# Patient Record
Sex: Female | Born: 1935 | Race: Black or African American | Hispanic: Refuse to answer | Smoking: Former smoker
Health system: Southern US, Community
[De-identification: ages and names within clinical notes are randomized; demographics above are authoritative.]

## PROBLEM LIST (undated history)

## (undated) DIAGNOSIS — Z9889 Other specified postprocedural states: Secondary | ICD-10-CM

## (undated) DIAGNOSIS — R0602 Shortness of breath: Secondary | ICD-10-CM

## (undated) DIAGNOSIS — E789 Disorder of lipoprotein metabolism, unspecified: Secondary | ICD-10-CM

## (undated) DIAGNOSIS — M810 Age-related osteoporosis without current pathological fracture: Secondary | ICD-10-CM

## (undated) DIAGNOSIS — R112 Nausea with vomiting, unspecified: Secondary | ICD-10-CM

## (undated) DIAGNOSIS — M1990 Unspecified osteoarthritis, unspecified site: Secondary | ICD-10-CM

## (undated) DIAGNOSIS — R062 Wheezing: Secondary | ICD-10-CM

## (undated) DIAGNOSIS — R202 Paresthesia of skin: Secondary | ICD-10-CM

## (undated) DIAGNOSIS — I2510 Atherosclerotic heart disease of native coronary artery without angina pectoris: Secondary | ICD-10-CM

## (undated) DIAGNOSIS — E785 Hyperlipidemia, unspecified: Secondary | ICD-10-CM

## (undated) DIAGNOSIS — J310 Chronic rhinitis: Secondary | ICD-10-CM

## (undated) DIAGNOSIS — C439 Malignant melanoma of skin, unspecified: Secondary | ICD-10-CM

## (undated) DIAGNOSIS — C801 Malignant (primary) neoplasm, unspecified: Secondary | ICD-10-CM

## (undated) DIAGNOSIS — I739 Peripheral vascular disease, unspecified: Secondary | ICD-10-CM

## (undated) DIAGNOSIS — J449 Chronic obstructive pulmonary disease, unspecified: Secondary | ICD-10-CM

## (undated) DIAGNOSIS — R200 Anesthesia of skin: Secondary | ICD-10-CM

## (undated) HISTORY — PX: SKIN BIOPSY: SHX1

## (undated) HISTORY — DX: Anesthesia of skin: R20.0

## (undated) HISTORY — DX: Unspecified osteoarthritis, unspecified site: M19.90

## (undated) HISTORY — DX: Chronic obstructive pulmonary disease, unspecified: J44.9

## (undated) HISTORY — PX: ANGIOPLASTY: SHX39

## (undated) HISTORY — DX: Age-related osteoporosis without current pathological fracture: M81.0

## (undated) HISTORY — DX: Hyperlipidemia, unspecified: E78.5

## (undated) HISTORY — DX: Other specified postprocedural states: Z98.890

## (undated) HISTORY — DX: Peripheral vascular disease, unspecified: I73.9

## (undated) HISTORY — DX: Disorder of lipoprotein metabolism, unspecified: E78.9

## (undated) HISTORY — DX: Chronic rhinitis: J31.0

## (undated) HISTORY — PX: COLONOSCOPY: SHX174

## (undated) HISTORY — DX: Wheezing: R06.2

## (undated) HISTORY — PX: SPINE SURGERY: SHX786

## (undated) HISTORY — DX: Malignant (primary) neoplasm, unspecified: C80.1

## (undated) HISTORY — DX: Shortness of breath: R06.02

## (undated) HISTORY — DX: Nausea with vomiting, unspecified: R11.2

## (undated) HISTORY — DX: Atherosclerotic heart disease of native coronary artery without angina pectoris: I25.10

## (undated) HISTORY — DX: Malignant melanoma of skin, unspecified: C43.9

## (undated) HISTORY — PX: EYE SURGERY: SHX253

## (undated) HISTORY — DX: Paresthesia of skin: R20.2

## (undated) HISTORY — PX: OTHER SURGICAL HISTORY: SHX169

---

## 2017-03-19 HISTORY — PX: FEMORAL-POPLITEAL BYPASS GRAFT: SHX937

## 2017-03-19 HISTORY — PX: THROMBECTOMY FEMORAL ARTERY: SHX6406

## 2017-03-19 HISTORY — PX: FASCIOTOMY: SHX132

## 2019-02-06 ENCOUNTER — Encounter: Payer: Self-pay | Admitting: Vascular Surgery

## 2019-02-26 ENCOUNTER — Ambulatory Visit: Payer: Medicare Other | Admitting: Rheumatology

## 2019-03-17 ENCOUNTER — Ambulatory Visit (INDEPENDENT_AMBULATORY_CARE_PROVIDER_SITE_OTHER): Payer: Medicare Other | Admitting: Medical

## 2019-03-17 ENCOUNTER — Other Ambulatory Visit: Payer: Self-pay

## 2019-03-17 ENCOUNTER — Encounter: Payer: Self-pay | Admitting: Medical

## 2019-03-17 VITALS — BP 145/81 | HR 95 | Temp 98.2°F | Resp 18 | Ht 62.0 in | Wt 113.8 lb

## 2019-03-17 DIAGNOSIS — R030 Elevated blood-pressure reading, without diagnosis of hypertension: Secondary | ICD-10-CM | POA: Diagnosis not present

## 2019-03-17 DIAGNOSIS — R7989 Other specified abnormal findings of blood chemistry: Secondary | ICD-10-CM | POA: Diagnosis not present

## 2019-03-17 DIAGNOSIS — I739 Peripheral vascular disease, unspecified: Secondary | ICD-10-CM

## 2019-03-17 DIAGNOSIS — M7062 Trochanteric bursitis, left hip: Secondary | ICD-10-CM

## 2019-03-17 DIAGNOSIS — E785 Hyperlipidemia, unspecified: Secondary | ICD-10-CM

## 2019-03-17 DIAGNOSIS — M810 Age-related osteoporosis without current pathological fracture: Secondary | ICD-10-CM

## 2019-03-17 DIAGNOSIS — Z86718 Personal history of other venous thrombosis and embolism: Secondary | ICD-10-CM

## 2019-03-17 MED ORDER — VITAMIN D (ERGOCALCIFEROL) 1.25 MG (50000 UNIT) PO CAPS: 50000.0000 [IU] | ORAL_CAPSULE | ORAL | 2 refills | Status: DC

## 2019-03-17 MED ORDER — ATORVASTATIN CALCIUM 80 MG PO TABS: 80.0000 mg | ORAL_TABLET | Freq: Every day | ORAL | 3 refills | Status: DC

## 2019-03-17 NOTE — Progress Notes (Signed)
Subjective:    Patient ID: Holly Briggs, female    DOB: [birth year], 84 y.o.   MRN: [id number]  HPI  Pt in for the first time. Pt new to area for 4 weeks. Pt moved from [facility]. Had lived there 17 years. Pt has relative that in [place].    Pt has some left hip area pain. Pt in the past saw rheumatologist. She wants to see new rheumatologist. Pt states appointment was given for [facility] rheumatologist scheduled for June 09, 2019. Pt states in a lot of pain so she will be will to see sports medicine MD.  Pt has high cholesterol in the past.   Also has some vascular issues(PAD on review) Pt states hx of clot in legs years ago also. She was given eliquis. Pt has appointment with Dr.  Donnetta Hutching already made for 2 weeks. Pt former pcp in [facility] arranged that.  Pt has hx of scoliosis and osteoporosis.   Also low vit D    Review of Systems  Constitutional: Negative for chills, fatigue and fever.  HENT: Negative for congestion and drooling.   Respiratory: Negative for cough, chest tightness, shortness of breath and wheezing.   Cardiovascular: Negative for palpitations.  Gastrointestinal: Negative for abdominal distention, abdominal pain, nausea and vomiting.  Genitourinary: Negative for difficulty urinating and dysuria.  Musculoskeletal:       Left hip pain.  Skin: Negative for rash.  Neurological: Negative for dizziness, syncope, weakness, numbness and headaches.  Hematological: Negative for adenopathy. Does not bruise/bleed easily.  Psychiatric/Behavioral: Negative for behavioral problems and decreased concentration. The patient is not nervous/anxious.     Past Medical History:  Diagnosis Date  . Arthritis   . Cancer (HCC)    Basal cell carcinoma  . Cancer (HCC)    Squamous cell carcinoma  . COPD (chronic obstructive pulmonary disease) ([place])   . Coronary artery disease   . Hyperlipidemia   . Lipid disorder   . Melanoma ([place])   . Numbness and tingling of right leg    Mild  residual  . Osteoporosis   . Peripheral arterial disease ([place])   . PONV (postoperative nausea and vomiting)   . Rhinitis   . Shortness of breath   . Wheezing      Social History   Socioeconomic History  . Marital status: Divorced    Spouse name: Not on file  . Number of children: Not on file  . Years of education: Not on file  . Highest education level: Not on file  Occupational History  . Not on file  Tobacco Use  . Smoking status: Former Smoker    Packs/day: 1.00    Years: 50.00    Pack years: 50.00    Types: Cigarettes    Quit date: 03/17/2007    Years since quitting: 12.0  . Smokeless tobacco: Never Used  Substance and Sexual Activity  . Alcohol use: Never  . Drug use: Never  . Sexual activity: Not Currently    Birth control/protection: None  Other Topics Concern  . Not on file  Social History Narrative  . Not on file   Social Determinants of Health   Financial Resource Strain:   . Difficulty of Paying Living Expenses: Not on file  Food Insecurity:   . Worried About Charity fundraiser in the Last Year: Not on file  . Ran Out of Food in the Last Year: Not on file  Transportation Needs:   . Lack of Transportation (Medical): Not  on file  . Lack of Transportation (Non-Medical): Not on file  Physical Activity:   . Days of Exercise per Week: Not on file  . Minutes of Exercise per Session: Not on file  Stress:   . Feeling of Stress : Not on file  Social Connections:   . Frequency of Communication with Friends and Family: Not on file  . Frequency of Social Gatherings with Friends and Family: Not on file  . Attends Religious Services: Not on file  . Active Member of Clubs or Organizations: Not on file  . Attends Archivist Meetings: Not on file  . Marital Status: Not on file  Intimate Partner Violence:   . Fear of Current or Ex-Partner: Not on file  . Emotionally Abused: Not on file  . Physically Abused: Not on file  . Sexually Abused: Not on file     Past Surgical History:  Procedure Laterality Date  . ANGIOPLASTY    . Arteriography    . COLONOSCOPY    . EYE SURGERY     Laser vision correction  . FASCIOTOMY  03/2017   4 compartment.  . FEMORAL-POPLITEAL BYPASS GRAFT  03/2017   with Prosthetic for ciritcal lim ischemia, 2/2 thrombosis of existing graft.  . Iliofemoral Endarterectomy Right   . SKIN BIOPSY    . SPINE SURGERY     Fusion, Lumbar  . THROMBECTOMY FEMORAL ARTERY  03/2017    Family History  Problem Relation Age of Onset  . Hyperlipidemia Mother   . Pneumonia Father   . Hyperlipidemia Sister   . Hyperlipidemia Brother   . Osteoporosis Sister     Allergies  Allergen Reactions  . Penicillins     "Mouth broke out in sores"  . Prednisone Diarrhea    Current Outpatient Medications on File Prior to Visit  Medication Sig Dispense Refill  . apixaban (ELIQUIS) 2.5 MG TABS tablet Take 2.5 mg by mouth 2 (two) times daily.    Marland Kitchen aspirin EC 81 MG tablet Take 81 mg by mouth daily.    . Riboflavin (VITAMIN B-2 PO) Take 1 tablet by mouth once a week.     No current facility-administered medications on file prior to visit.    BP (!) 145/81 (BP Location: Left Arm, Patient Position: Sitting, Cuff Size: Normal)   Pulse 95   Temp 98.2 F (36.8 C) (Oral)   Resp 18   Ht 5\' 2"  (1.575 m)   Wt 113 lb 12.8 oz (51.6 kg)   SpO2 99%   BMI 20.81 kg/m       Objective:   Physical Exam   General- No acute distress. Pleasant patient. Neck- Full range of motion, no jvd Lungs- Clear, even and unlabored. Heart- regular rate and rhythm. Neurologic- CNII- XII grossly intact.  Left hip- pain on range of motion.     Assessment & Plan:  For left hip pain, I put in referral to sports medicine. Can call them and get scheduled.  For future labs please get scheduled for those fasting.  For dvt hx continue eliquis.  For PAD, see vascular surgeon.  For elevated blood pressure will continue to follow. bp reading today  appears to be situational.  For high choleseterol lipitor rx printed.  For low vit d, rx printed ergocalciferol.   Follow up date to be determined after lab review.  Mackie Pai, PA-C

## 2019-03-17 NOTE — Patient Instructions (Signed)
For left hip pain, I put in referral to sports medicine. Can call them and get scheduled.  For future labs please get scheduled for those fasting.  For dvt hx continue eliquis.  For PAD, see vascular surgeon.  For elevated blood pressure will continue to follow. bp reading today appears to be situational.  For high choleseterol lipitor rx printed.  For low vit d, rx printed ergocalciferol.   Follow up date to be determined after lab review.

## 2019-03-23 ENCOUNTER — Telehealth (HOSPITAL_COMMUNITY): Payer: Self-pay

## 2019-03-23 ENCOUNTER — Other Ambulatory Visit: Payer: Self-pay

## 2019-03-23 DIAGNOSIS — I739 Peripheral vascular disease, unspecified: Secondary | ICD-10-CM

## 2019-03-23 NOTE — Telephone Encounter (Signed)
The above patient or their representative was contacted and gave the following answers to these questions:         Do you have any of the following symptoms? No  Fever                    Cough                   Shortness of breath  Do  you have any of the following other symptoms?    muscle pain         vomiting,        diarrhea        rash         weakness        red eye        abdominal pain         bruising          bruising or bleeding              joint pain           severe headache    Have you been in contact with someone who was or has been sick in the past 2 weeks? No  Yes                 Unsure                         Unable to assess   Does the person that you were in contact with have any of the following symptoms?   Cough         shortness of breath           muscle pain         vomiting,            diarrhea            rash            weakness           fever            red eye           abdominal pain           bruising  or  bleeding                joint pain                severe headache              Patient was given directions to the office from [place].   COMMENTS OR ACTION PLAN FOR THIS PATIENT:

## 2019-03-24 ENCOUNTER — Ambulatory Visit (HOSPITAL_COMMUNITY)
Admission: RE | Admit: 2019-03-24 | Discharge: 2019-03-24 | Disposition: A | Discharge status: Home / Self Care | Payer: Medicare Other | Source: Ambulatory Visit | Attending: Family | Admitting: Family

## 2019-03-24 ENCOUNTER — Other Ambulatory Visit: Payer: Self-pay

## 2019-03-24 ENCOUNTER — Encounter: Payer: Self-pay | Admitting: Vascular Surgery

## 2019-03-24 ENCOUNTER — Ambulatory Visit (INDEPENDENT_AMBULATORY_CARE_PROVIDER_SITE_OTHER): Payer: Medicare Other | Admitting: Vascular Surgery

## 2019-03-24 VITALS — BP 156/82 | HR 70 | Temp 97.8°F | Resp 18 | Ht 62.0 in | Wt 113.6 lb

## 2019-03-24 DIAGNOSIS — I739 Peripheral vascular disease, unspecified: Secondary | ICD-10-CM | POA: Diagnosis present

## 2019-03-24 NOTE — Progress Notes (Signed)
Vascular and Vein Specialist of [facility]  Patient name: Holly Briggs MRN: [id number] DOB: 1935-01-21 Sex: female  REASON FOR CONSULT: Establish vascular surgery care  HPI: Holly Briggs is a 85 y.o. female, who is here today to establish care.  She recently moved to this area from the [facility] region.  She has a complicated past vascular history.  She underwent an initial femoral to popliteal bypass in 2010.  I do not have documentation of whether this was vein or prosthetic.  He she presented emergently 2 years ago with an occluded femoral to popliteal bypass and underwent emergent common and deep femoral thrombectomy and right femoral to below-knee popliteal bypass with Gore-Tex.  Also underwent 4 compartment fasciotomy.  She eventually healed all of her wounds and returned to her baseline function.  She reports that she does have some mild swelling that has persisted in her right foot but otherwise is able to walk without difficulty.  She has no claudication symptoms.  She does have osteoporosis with hip and back discomfort which limits her.  Fortunately she quit smoking around the time of her initial bypass and has not smoked since then.  She is on Eliquis and aspirin and statin  Past Medical History:  Diagnosis Date  . Arthritis   . Cancer (HCC)    Basal cell carcinoma  . Cancer (HCC)    Squamous cell carcinoma  . COPD (chronic obstructive pulmonary disease) ([place])   . Coronary artery disease   . Hyperlipidemia   . Lipid disorder   . Melanoma ([place])   . Numbness and tingling of right leg    Mild residual  . Osteoporosis   . Peripheral arterial disease ([place])   . PONV (postoperative nausea and vomiting)   . Rhinitis   . Shortness of breath   . Wheezing     Family History  Problem Relation Age of Onset  . Hyperlipidemia Mother   . Pneumonia Father   . Hyperlipidemia Sister   . Hyperlipidemia Brother   . Osteoporosis  Sister     SOCIAL HISTORY: Social History   Socioeconomic History  . Marital status: Divorced    Spouse name: Not on file  . Number of children: Not on file  . Years of education: Not on file  . Highest education level: Not on file  Occupational History  . Not on file  Tobacco Use  . Smoking status: Former Smoker    Packs/day: 1.00    Years: 50.00    Pack years: 50.00    Types: Cigarettes    Quit date: 03/17/2007    Years since quitting: 12.0  . Smokeless tobacco: Never Used  Substance and Sexual Activity  . Alcohol use: Never  . Drug use: Never  . Sexual activity: Not Currently    Birth control/protection: None  Other Topics Concern  . Not on file  Social History Narrative  . Not on file   Social Determinants of Health   Financial Resource Strain:   . Difficulty of Paying Living Expenses: Not on file  Food Insecurity:   . Worried About Charity fundraiser in the Last Year: Not on file  . Ran Out of Food in the Last Year: Not on file  Transportation Needs:   . Lack of Transportation (Medical): Not on file  . Lack of Transportation (Non-Medical): Not on file  Physical Activity:   . Days of Exercise per Week: Not on file  . Minutes of Exercise per  Session: Not on file  Stress:   . Feeling of Stress : Not on file  Social Connections:   . Frequency of Communication with Friends and Family: Not on file  . Frequency of Social Gatherings with Friends and Family: Not on file  . Attends Religious Services: Not on file  . Active Member of Clubs or Organizations: Not on file  . Attends Archivist Meetings: Not on file  . Marital Status: Not on file  Intimate Partner Violence:   . Fear of Current or Ex-Partner: Not on file  . Emotionally Abused: Not on file  . Physically Abused: Not on file  . Sexually Abused: Not on file    Allergies  Allergen Reactions  . Penicillins     "Mouth broke out in sores"  . Prednisone Diarrhea    Current Outpatient  Medications  Medication Sig Dispense Refill  . apixaban (ELIQUIS) 2.5 MG TABS tablet Take 2.5 mg by mouth 2 (two) times daily.    Marland Kitchen aspirin EC 81 MG tablet Take 81 mg by mouth daily.    Marland Kitchen atorvastatin (LIPITOR) 80 MG tablet Take 1 tablet (80 mg total) by mouth daily. 90 tablet 3  . Riboflavin (VITAMIN B-2 PO) Take 1 tablet by mouth once a week.    . Vitamin D, Ergocalciferol, (DRISDOL) 1.25 MG (50000 UT) CAPS capsule Take 1 capsule (50,000 Units total) by mouth every 7 (seven) days. 8 capsule 2   No current facility-administered medications for this visit.    REVIEW OF SYSTEMS:  [X]  denotes positive finding, [ ]  denotes negative finding Cardiac  Comments:  Chest pain or chest pressure:    Shortness of breath upon exertion:    Short of breath when lying flat:    Irregular heart rhythm:        Vascular    Pain in calf, thigh, or hip brought on by ambulation:    Pain in feet at night that wakes you up from your sleep:     Blood clot in your veins:    Leg swelling:         Pulmonary    Oxygen at home:    Productive cough:     Wheezing:         Neurologic    Sudden weakness in arms or legs:     Sudden numbness in arms or legs:     Sudden onset of difficulty speaking or slurred speech:    Temporary loss of vision in one eye:     Problems with dizziness:         Gastrointestinal    Blood in stool:     Vomited blood:         Genitourinary    Burning when urinating:     Blood in urine:        Psychiatric    Major depression:         Hematologic    Bleeding problems:    Problems with blood clotting too easily:        Skin    Rashes or ulcers:        Constitutional    Fever or chills:      PHYSICAL EXAM: Vitals:   03/24/19 0927  BP: (!) 156/82  Pulse: 70  Resp: 18  Temp: 97.8 F (36.6 C)  SpO2: 96%  Weight: 113 lb 9.6 oz (51.5 kg)  Height: 5\' 2"  (1.575 m)    GENERAL: The patient is a well-nourished female, in  no acute distress. The vital signs are  documented above. CARDIOVASCULAR: Rotted arteries without bruits bilaterally.  2+ radial 2+ femoral 2+ popliteal 2+ dorsalis pedis and posterior tibial pulses bilaterally PULMONARY: There is good air exchange  ABDOMEN: Soft and non-tender  MUSCULOSKELETAL: There are no major deformities or cyanosis. NEUROLOGIC: No focal weakness or paresthesias are detected. SKIN: There are no ulcers or rashes noted. PSYCHIATRIC: The patient has a normal affect.  DATA:  Ankle arm index normal bilaterally with triphasic waveforms bilaterally in our lab today  MEDICAL ISSUES: Stable 2 years status post redo prosthetic right femoral to below-knee popliteal bypass and 4 compartment fasciotomy.  We will continue her usual activities and walking program.  Her last ultrasound in [place] was in May.  I have recommended that she undergo a graft scan in May of this year at 1 year and then yearly graft scan and ankle arm index follow-up.  We discussed symptoms of graft thrombosis and she knows to present immediately should this occur   Rosetta Posner, MD [facility] Vascular and Vein Specialists of [facility] Tel [phone] Pager [phone]

## 2019-03-26 ENCOUNTER — Encounter: Payer: Self-pay | Admitting: Family Medicine

## 2019-03-26 ENCOUNTER — Other Ambulatory Visit (INDEPENDENT_AMBULATORY_CARE_PROVIDER_SITE_OTHER): Payer: Medicare Other

## 2019-03-26 ENCOUNTER — Ambulatory Visit (HOSPITAL_BASED_OUTPATIENT_CLINIC_OR_DEPARTMENT_OTHER)
Admission: RE | Admit: 2019-03-26 | Discharge: 2019-03-26 | Disposition: A | Discharge status: Home / Self Care | Payer: Medicare Other | Source: Ambulatory Visit | Attending: Family Medicine | Admitting: Family Medicine

## 2019-03-26 ENCOUNTER — Ambulatory Visit: Payer: Self-pay

## 2019-03-26 ENCOUNTER — Ambulatory Visit (INDEPENDENT_AMBULATORY_CARE_PROVIDER_SITE_OTHER): Payer: Medicare Other | Admitting: Family Medicine

## 2019-03-26 ENCOUNTER — Other Ambulatory Visit: Payer: Self-pay

## 2019-03-26 DIAGNOSIS — M7062 Trochanteric bursitis, left hip: Secondary | ICD-10-CM

## 2019-03-26 DIAGNOSIS — R030 Elevated blood-pressure reading, without diagnosis of hypertension: Secondary | ICD-10-CM

## 2019-03-26 DIAGNOSIS — M810 Age-related osteoporosis without current pathological fracture: Secondary | ICD-10-CM

## 2019-03-26 DIAGNOSIS — E785 Hyperlipidemia, unspecified: Secondary | ICD-10-CM

## 2019-03-26 DIAGNOSIS — R7989 Other specified abnormal findings of blood chemistry: Secondary | ICD-10-CM

## 2019-03-26 HISTORY — DX: Trochanteric bursitis, left hip: M70.62

## 2019-03-26 LAB — LIPID PANEL
Cholesterol: 148 mg/dL (ref 0–200)
HDL: 53.7 mg/dL (ref >39.00)
LDL Cholesterol: 76 mg/dL (ref 0–99)
NonHDL: 94.45
Total CHOL/HDL Ratio: 3
Triglycerides: 93 mg/dL (ref 0.0–149.0)
VLDL: 18.6 mg/dL (ref 0.0–40.0)

## 2019-03-26 LAB — CBC WITH DIFFERENTIAL/PLATELET
Basophils Absolute: 0.1 10*3/uL (ref 0.0–0.1)
Basophils Relative: 0.9 % (ref 0.0–3.0)
Eosinophils Absolute: 0.2 10*3/uL (ref 0.0–0.7)
Eosinophils Relative: 2 % (ref 0.0–5.0)
HCT: 38.2 % (ref 36.0–46.0)
Hemoglobin: 12.7 g/dL (ref 12.0–15.0)
Lymphocytes Relative: 21.8 % (ref 12.0–46.0)
Lymphs Abs: 1.6 10*3/uL (ref 0.7–4.0)
MCHC: 33.1 g/dL (ref 30.0–36.0)
MCV: 92.5 fl (ref 78.0–100.0)
Monocytes Absolute: 0.5 10*3/uL (ref 0.1–1.0)
Monocytes Relative: 6.2 % (ref 3.0–12.0)
Neutro Abs: 5.2 10*3/uL (ref 1.4–7.7)
Neutrophils Relative %: 69.1 % (ref 43.0–77.0)
Platelets: 261 10*3/uL (ref 150.0–400.0)
RBC: 4.13 Mil/uL (ref 3.87–5.11)
RDW: 14.4 % (ref 11.5–15.5)
WBC: 7.5 10*3/uL (ref 4.0–10.5)

## 2019-03-26 LAB — COMPREHENSIVE METABOLIC PANEL
ALT: 20 U/L (ref 0–35)
AST: 24 U/L (ref 0–37)
Albumin: 4.3 g/dL (ref 3.5–5.2)
Alkaline Phosphatase: 70 U/L (ref 39–117)
BUN: 16 mg/dL (ref 6–23)
CO2: 31 mEq/L (ref 19–32)
Calcium: 9.8 mg/dL (ref 8.4–10.5)
Chloride: 104 mEq/L (ref 96–112)
Creatinine, Ser: 0.97 mg/dL (ref 0.40–1.20)
GFR: 54.96 mL/min — ABNORMAL LOW (ref >60.00)
Glucose, Bld: 104 mg/dL — ABNORMAL HIGH (ref 70–99)
Potassium: 5 mEq/L (ref 3.5–5.1)
Sodium: 142 mEq/L (ref 135–145)
Total Bilirubin: 0.9 mg/dL (ref 0.2–1.2)
Total Protein: 6.4 g/dL (ref 6.0–8.3)

## 2019-03-26 MED ORDER — METHYLPREDNISOLONE ACETATE 40 MG/ML IJ SUSP
40.0000 mg | Freq: Once | INTRAMUSCULAR | Status: AC
  Administered 2019-03-26: 40 mg via INTRA_ARTICULAR

## 2019-03-26 NOTE — Assessment & Plan Note (Signed)
Significant asymmetry with a severe scoliosis.  Likely this is leading to her repeated bursitis and weakness with hip abduction.  Has had improvement previously with physical therapy. - injection  -Counseled on home exercise therapy and supportive care. -Provided samples of Pennsaid. -X-ray -Could consider Prolia as she has a history of osteoporosis -Consider physical therapy.

## 2019-03-26 NOTE — Patient Instructions (Addendum)
Nice to meet you Please try ice  Please try the exercises  Please try Aspercreme with lidocaine I will call with the results.  Please send me a message in MyChart with any questions or updates.  Please see me back in 4 weeks.   --Dr. Raeford Razor

## 2019-03-26 NOTE — Progress Notes (Signed)
Holly Briggs - 85 y.o. female MRN [id number]  Date of birth: [birth year]  SUBJECTIVE:  Including CC & ROS.  Chief Complaint  Patient presents with  . Hip Pain    left hip    Holly Briggs is a 85 y.o. female that is presenting with acute on chronic left hip pain.  She is recently moved from [place].  She was seen rheumatology and receiving injections for the hip.  The injections tend to help.  She has severe scoliosis and asymmetry of the hips.  She denies any specific inciting event.  It is worse with ambulation or transitioning from sitting to standing.  Is on Eliquis.  No numbness or tingling.  No radicular symptoms.   Review of Systems See HPI   HISTORY: Past Medical, Surgical, Social, and Family History Reviewed & Updated per EMR.   Pertinent Historical Findings include:  Past Medical History:  Diagnosis Date  . Arthritis   . Cancer (HCC)    Basal cell carcinoma  . Cancer (HCC)    Squamous cell carcinoma  . COPD (chronic obstructive pulmonary disease) ([place])   . Coronary artery disease   . Hyperlipidemia   . Lipid disorder   . Melanoma ([place])   . Numbness and tingling of right leg    Mild residual  . Osteoporosis   . Peripheral arterial disease ([place])   . PONV (postoperative nausea and vomiting)   . Rhinitis   . Shortness of breath   . Wheezing     Past Surgical History:  Procedure Laterality Date  . ANGIOPLASTY    . Arteriography    . COLONOSCOPY    . EYE SURGERY     Laser vision correction  . FASCIOTOMY  03/2017   4 compartment.  . FEMORAL-POPLITEAL BYPASS GRAFT  03/2017   with Prosthetic for ciritcal lim ischemia, 2/2 thrombosis of existing graft.  . Iliofemoral Endarterectomy Right   . SKIN BIOPSY    . SPINE SURGERY     Fusion, Lumbar  . THROMBECTOMY FEMORAL ARTERY  03/2017    Allergies  Allergen Reactions  . Penicillins     "Mouth broke out in sores"  . Prednisone Diarrhea    Family History  Problem Relation Age of Onset  .  Hyperlipidemia Mother   . Pneumonia Father   . Hyperlipidemia Sister   . Hyperlipidemia Brother   . Osteoporosis Sister      Social History   Socioeconomic History  . Marital status: Divorced    Spouse name: Not on file  . Number of children: Not on file  . Years of education: Not on file  . Highest education level: Not on file  Occupational History  . Not on file  Tobacco Use  . Smoking status: Former Smoker    Packs/day: 1.00    Years: 50.00    Pack years: 50.00    Types: Cigarettes    Quit date: 03/17/2007    Years since quitting: 12.0  . Smokeless tobacco: Never Used  Substance and Sexual Activity  . Alcohol use: Never  . Drug use: Never  . Sexual activity: Not Currently    Birth control/protection: None  Other Topics Concern  . Not on file  Social History Narrative  . Not on file   Social Determinants of Health   Financial Resource Strain:   . Difficulty of Paying Living Expenses: Not on file  Food Insecurity:   . Worried About Charity fundraiser in the Last Year: Not on  file  . [place] in the Last Year: Not on file  Transportation Needs:   . Lack of Transportation (Medical): Not on file  . Lack of Transportation (Non-Medical): Not on file  Physical Activity:   . Days of Exercise per Week: Not on file  . Minutes of Exercise per Session: Not on file  Stress:   . Feeling of Stress : Not on file  Social Connections:   . Frequency of Communication with Friends and Family: Not on file  . Frequency of Social Gatherings with Friends and Family: Not on file  . Attends Religious Services: Not on file  . Active Member of Clubs or Organizations: Not on file  . Attends Archivist Meetings: Not on file  . Marital Status: Not on file  Intimate Partner Violence:   . Fear of Current or Ex-Partner: Not on file  . Emotionally Abused: Not on file  . Physically Abused: Not on file  . Sexually Abused: Not on file     PHYSICAL EXAM:  VS: BP 138/84    Pulse 70   Ht 5\' 2"  (1.575 m)   Wt 113 lb (51.3 kg)   BMI 20.67 kg/m  Physical Exam Gen: NAD, alert, cooperative with exam, well-appearing ENT: normal lips, normal nasal mucosa,  Eye: normal EOM, normal conjunctiva and lids Skin: no rashes, no areas of induration  Neuro: normal tone, normal sensation to touch Psych:  normal insight, alert and oriented MSK:  Left hip: Obvious asymmetry and no severe scoliosis noted. Tenderness palpation of the greater trochanter. Normal internal and external rotation of the hips. Normal strength resistance with hip flexion, knee flexion and extension, plantarflexion and dorsiflexion. Negative straight leg raise. Neurovascularly intact   Aspiration/Injection Procedure Note Holly Briggs 03-Dec-1934  Procedure: Injection Indications: Left hip pain  Procedure Details Consent: Risks of procedure as well as the alternatives and risks of each were explained to the (patient/caregiver).  Consent for procedure obtained. Time Out: Verified patient identification, verified procedure, site/side was marked, verified correct patient position, special equipment/implants available, medications/allergies/relevent history reviewed, required imaging and test results available.  Performed.  The area was cleaned with iodine and alcohol swabs.    The left greater trochanteric bursa was injected using 1 cc's of 40 mg Depo-Medrol and 4 cc's of 0.25% bupivacaine with a 22 1 1/2" needle.  Ultrasound was used. Images were obtained in short views showing the injection.     A sterile dressing was applied.  Patient did tolerate procedure well.     ASSESSMENT & PLAN:   Trochanteric bursitis of left hip Significant asymmetry with a severe scoliosis.  Likely this is leading to her repeated bursitis and weakness with hip abduction.  Has had improvement previously with physical therapy. - injection  -Counseled on home exercise therapy and supportive care. -Provided  samples of Pennsaid. -X-ray -Could consider Prolia as she has a history of osteoporosis -Consider physical therapy.

## 2019-03-26 NOTE — Progress Notes (Signed)
Medication Samples have been provided to the patient.  Drug name: Pennsaid       Strength: 2%        Qty: 2 Boxes   LOT: [id number]  Exp.Date: 05/2019  Dosing instructions: Use a pea size amount and rub gently.  The patient has been instructed regarding the correct time, dose, and frequency of taking this medication, including desired effects and most common side effects.   Sherrie George, MA 8:59 AM 03/26/2019

## 2019-03-27 ENCOUNTER — Telehealth: Payer: Self-pay | Admitting: Family Medicine

## 2019-03-27 ENCOUNTER — Other Ambulatory Visit: Payer: Self-pay | Admitting: *Deleted

## 2019-03-27 DIAGNOSIS — I739 Peripheral vascular disease, unspecified: Secondary | ICD-10-CM

## 2019-03-27 NOTE — Telephone Encounter (Signed)
Informed of results.   Rosemarie Ax, MD [facility] 03/27/2019, 8:19 AM

## 2019-03-31 LAB — VITAMIN D 1,25 DIHYDROXY
Vitamin D 1, 25 (OH)2 Total: 23 pg/mL (ref 18–72)
Vitamin D2 1, 25 (OH)2: 23 pg/mL
Vitamin D3 1, 25 (OH)2: <8 pg/mL

## 2019-04-07 ENCOUNTER — Ambulatory Visit: Payer: Medicare Other | Admitting: Rheumatology

## 2019-04-21 ENCOUNTER — Ambulatory Visit: Payer: Self-pay | Admitting: Rheumatology

## 2019-04-22 ENCOUNTER — Ambulatory Visit (INDEPENDENT_AMBULATORY_CARE_PROVIDER_SITE_OTHER): Payer: Medicare Other | Admitting: Family Medicine

## 2019-04-22 ENCOUNTER — Encounter: Payer: Self-pay | Admitting: Family Medicine

## 2019-04-22 ENCOUNTER — Other Ambulatory Visit: Payer: Self-pay

## 2019-04-22 VITALS — BP 160/98 | HR 90 | Ht 62.0 in | Wt 114.0 lb

## 2019-04-22 DIAGNOSIS — L608 Other nail disorders: Secondary | ICD-10-CM

## 2019-04-22 DIAGNOSIS — M545 Low back pain, unspecified: Secondary | ICD-10-CM | POA: Insufficient documentation

## 2019-04-22 DIAGNOSIS — I739 Peripheral vascular disease, unspecified: Secondary | ICD-10-CM

## 2019-04-22 DIAGNOSIS — M7062 Trochanteric bursitis, left hip: Secondary | ICD-10-CM | POA: Diagnosis not present

## 2019-04-22 DIAGNOSIS — D049 Carcinoma in situ of skin, unspecified: Secondary | ICD-10-CM

## 2019-04-22 HISTORY — DX: Low back pain: M54.5

## 2019-04-22 HISTORY — DX: Other nail disorders: L60.8

## 2019-04-22 HISTORY — DX: Low back pain, unspecified: M54.50

## 2019-04-22 HISTORY — DX: Peripheral vascular disease, unspecified: I73.9

## 2019-04-22 HISTORY — DX: Carcinoma in situ of skin, unspecified: D04.9

## 2019-04-22 MED ORDER — APIXABAN 2.5 MG PO TABS: 2.5000 mg | ORAL_TABLET | Freq: Two times a day (BID) | ORAL | 5 refills | Status: DC

## 2019-04-22 NOTE — Assessment & Plan Note (Signed)
Pain is likely related to spasm as well as of degenerative changes in the lumbar spine.  She has a history of fusion.  She also demonstrates a bit of alignment issue as well as instability with standing on one leg. -Counseled on home exercise therapy and supportive care. -Could consider physical therapy

## 2019-04-22 NOTE — Assessment & Plan Note (Signed)
Has had different areas of cancer ranging from basal cell or squamous cell or melanoma. -Referral to dermatology.

## 2019-04-22 NOTE — Patient Instructions (Signed)
Good to see you Please see [place] across the hall or Gynecology down the hall for the mammogram.  Please try heat for the back  Please try the exercises   You should get a call about the referrals  Please send me a message in [place] with any questions or updates.  Please see me back in 4-6 weeks.   --Dr. Raeford Razor

## 2019-04-22 NOTE — Assessment & Plan Note (Signed)
Has a hard time with her nails and ongoing issues for which she had podiatry previously. - Referral to podiatry

## 2019-04-22 NOTE — Progress Notes (Signed)
Holly Briggs - 85 y.o. female MRN [id number]  Date of birth: 05/12/1934  SUBJECTIVE:  Including CC & ROS.  Chief Complaint  Patient presents with  . Follow-up    follow up for left hip    Holly Briggs is a 85 y.o. female that is following up for left hip pain and presenting with low back pain.  The left hip has improved with the injection.  She is having acute on chronic low back pain.  There is no radicular symptoms.  She has a history of fusion of the lumbar spine.  She feels unstable at times when she walks.  The pain is most relevant when she is transitioning from sitting to standing.  She also needs a refill of her Eliquis for her peripheral vascular disease.  She has a history of melanoma and squamous so carcinoma.  Has not established with a dermatologist.  Has a history of onychomycosis and is need of a podiatrist.   Review of Systems See HPI   HISTORY: Past Medical, Surgical, Social, and Family History Reviewed & Updated per EMR.   Pertinent Historical Findings include:  Past Medical History:  Diagnosis Date  . Arthritis   . Cancer (HCC)    Basal cell carcinoma  . Cancer (HCC)    Squamous cell carcinoma  . COPD (chronic obstructive pulmonary disease) ([place])   . Coronary artery disease   . Hyperlipidemia   . Lipid disorder   . Melanoma ([place])   . Numbness and tingling of right leg    Mild residual  . Osteoporosis   . Peripheral arterial disease ([place])   . PONV (postoperative nausea and vomiting)   . Rhinitis   . Shortness of breath   . Wheezing     Past Surgical History:  Procedure Laterality Date  . ANGIOPLASTY    . Arteriography    . COLONOSCOPY    . EYE SURGERY     Laser vision correction  . FASCIOTOMY  03/2017   4 compartment.  . FEMORAL-POPLITEAL BYPASS GRAFT  03/2017   with Prosthetic for ciritcal lim ischemia, 2/2 thrombosis of existing graft.  . Iliofemoral Endarterectomy Right   . SKIN BIOPSY    . SPINE SURGERY     Fusion, Lumbar  .  THROMBECTOMY FEMORAL ARTERY  03/2017    Allergies  Allergen Reactions  . Penicillins     "Mouth broke out in sores"  . Prednisone Diarrhea    Family History  Problem Relation Age of Onset  . Hyperlipidemia Mother   . Pneumonia Father   . Hyperlipidemia Sister   . Hyperlipidemia Brother   . Osteoporosis Sister      Social History   Socioeconomic History  . Marital status: Divorced    Spouse name: Not on file  . Number of children: Not on file  . Years of education: Not on file  . Highest education level: Not on file  Occupational History  . Not on file  Tobacco Use  . Smoking status: Former Smoker    Packs/day: 1.00    Years: 50.00    Pack years: 50.00    Types: Cigarettes    Quit date: 03/17/2007    Years since quitting: 12.1  . Smokeless tobacco: Never Used  Substance and Sexual Activity  . Alcohol use: Never  . Drug use: Never  . Sexual activity: Not Currently    Birth control/protection: None  Other Topics Concern  . Not on file  Social History Narrative  . Not  on file   Social Determinants of Health   Financial Resource Strain:   . Difficulty of Paying Living Expenses: Not on file  Food Insecurity:   . Worried About Charity fundraiser in the Last Year: Not on file  . Ran Out of Food in the Last Year: Not on file  Transportation Needs:   . Lack of Transportation (Medical): Not on file  . Lack of Transportation (Non-Medical): Not on file  Physical Activity:   . Days of Exercise per Week: Not on file  . Minutes of Exercise per Session: Not on file  Stress:   . Feeling of Stress : Not on file  Social Connections:   . Frequency of Communication with Friends and Family: Not on file  . Frequency of Social Gatherings with Friends and Family: Not on file  . Attends Religious Services: Not on file  . Active Member of Clubs or Organizations: Not on file  . Attends Archivist Meetings: Not on file  . Marital Status: Not on file  Intimate  Partner Violence:   . Fear of Current or Ex-Partner: Not on file  . Emotionally Abused: Not on file  . Physically Abused: Not on file  . Sexually Abused: Not on file     PHYSICAL EXAM:  VS: BP (!) 160/98   Pulse 90   Ht 5\' 2"  (1.575 m)   Wt 114 lb (51.7 kg)   BMI 20.85 kg/m  Physical Exam Gen: NAD, alert, cooperative with exam, well-appearing ENT: normal lips, normal nasal mucosa,  Eye: normal EOM, normal conjunctiva and lids Skin: no rashes, no areas of induration  Neuro: normal tone, normal sensation to touch Psych:  normal insight, alert and oriented MSK:  Left hip: No tenderness to palpation over the greater trochanter. Normal strength with hip flexion. Has good stability with 1 leg standing on the left when compared to the right. Back: Has a kyphotic posture. Has a bit of a right sided lean. Limitations with flexion. Neurovascularly intact     ASSESSMENT & PLAN:   Trochanteric bursitis of left hip Has had improvement with the injection. -Counseled on home exercise therapy and supportive care. -Could consider physical therapy.  Peripheral vascular disease ([place]) Has a complicated vascular history with graft and bypass.  Is followed by vascular. -Eliquis  Squamous cell carcinoma in situ (SCCIS) of skin Has had different areas of cancer ranging from basal cell or squamous cell or melanoma. -Referral to dermatology.  Acute bilateral low back pain without sciatica Pain is likely related to spasm as well as of degenerative changes in the lumbar spine.  She has a history of fusion.  She also demonstrates a bit of alignment issue as well as instability with standing on one leg. -Counseled on home exercise therapy and supportive care. -Could consider physical therapy  Onychomadesis of toenail Has a hard time with her nails and ongoing issues for which she had podiatry previously. - Referral to podiatry

## 2019-04-22 NOTE — Assessment & Plan Note (Signed)
Has a complicated vascular history with graft and bypass.  Is followed by vascular. -Eliquis

## 2019-04-22 NOTE — Assessment & Plan Note (Signed)
Has had improvement with the injection. -Counseled on home exercise therapy and supportive care. -Could consider physical therapy.

## 2019-04-23 ENCOUNTER — Ambulatory Visit: Payer: Medicare Other | Admitting: Family Medicine

## 2019-04-29 ENCOUNTER — Other Ambulatory Visit: Payer: Self-pay

## 2019-04-30 ENCOUNTER — Ambulatory Visit (HOSPITAL_BASED_OUTPATIENT_CLINIC_OR_DEPARTMENT_OTHER)
Admission: RE | Admit: 2019-04-30 | Discharge: 2019-04-30 | Disposition: A | Discharge status: Home / Self Care | Payer: Medicare Other | Source: Ambulatory Visit | Attending: Medical | Admitting: Medical

## 2019-04-30 ENCOUNTER — Other Ambulatory Visit: Payer: Self-pay

## 2019-04-30 ENCOUNTER — Ambulatory Visit (INDEPENDENT_AMBULATORY_CARE_PROVIDER_SITE_OTHER): Payer: Medicare Other | Admitting: Medical

## 2019-04-30 ENCOUNTER — Telehealth: Payer: Self-pay | Admitting: Medical

## 2019-04-30 VITALS — BP 136/77 | HR 81 | Temp 97.2°F | Resp 12 | Ht 62.0 in | Wt 115.4 lb

## 2019-04-30 DIAGNOSIS — R0789 Other chest pain: Secondary | ICD-10-CM

## 2019-04-30 DIAGNOSIS — M940 Chondrocostal junction syndrome [Tietze]: Secondary | ICD-10-CM | POA: Diagnosis not present

## 2019-04-30 DIAGNOSIS — R0781 Pleurodynia: Secondary | ICD-10-CM | POA: Diagnosis present

## 2019-04-30 DIAGNOSIS — N644 Mastodynia: Secondary | ICD-10-CM | POA: Diagnosis not present

## 2019-04-30 DIAGNOSIS — N63 Unspecified lump in unspecified breast: Secondary | ICD-10-CM

## 2019-04-30 LAB — TROPONIN I (HIGH SENSITIVITY): High Sens Troponin I: 3 ng/L (ref 2–17)

## 2019-04-30 NOTE — Patient Instructions (Addendum)
You do have recent left-sided breast pain.  On exam the pain is evident and I do think I feel a mass/lump underneath your left nipple.  We will go ahead and put in diagnostic mammogram order.  During the interim you can use Tylenol for pain as you have some rib region pain that could be a muscular type pain as well.  Also concerned that you might have some costochondritis pain.  However you cannot take NSAIDs due to use of Eliquis.  You had 2-second episode of left side costochondral junction region pain.  Your EKG showed sinus rhythm with some possible T wave changes in V1 and V2.  I do not have an old EKG to compare.  Your pain is very atypical but in light of your EKG would recommend ED evaluation in the event of recurrent persisting type pain.  Presently we will go ahead and do 1 stat troponin since the pain resolved after 2 seconds and low suspicion.  We will also go ahead and get left rib series/chest as you do have some rib pain and you also have history of smoking.  Follow-up in 7 to 10 days or as needed.

## 2019-04-30 NOTE — Telephone Encounter (Signed)
Diagnostic mammogram placed. Will you get her to call breast center to make sure she get scheduled.

## 2019-04-30 NOTE — Progress Notes (Signed)
Subjective:    Patient ID: Holly Briggs, female    DOB: 05-22-1934, 85 y.o.   MRN: [id number]  HPI  Pt in for evaluation breast area pain. I have seen her once in December. 2nd visit with me.  Pt state pain underneath left breast and toward sternum area. Pain not present now. Pt states pain for about 10 days on and off. Worse pain when takes bra off. No pleuritic pain. No pain on movement of left upper ext. No pain on lifting   No pain laying on left side.  When has pain is briefly and dull for seconds  Pt has not had mammogram for 25-30 years. Pt mentions tingling sensation to nipple at times. No discharge from nipple. No family history of breast cancer.   No rash in area underneath her breast.  Intitially reported no ain but later mid  exam she has about 2 second transient lower sternal pain. Pointed to costochondral junction.    Review of Systems  Constitutional: Negative for chills, fatigue and fever.  HENT: Negative for congestion.   Respiratory: Negative for cough, chest tightness, shortness of breath and wheezing.   Cardiovascular: Positive for chest pain. Negative for palpitations.       Atypical.  Gastrointestinal: Negative for abdominal pain.  Musculoskeletal: Negative for back pain and neck pain.       Breast pain left side on palpiton. And some below breast.  Skin: Negative for rash.  Neurological: Negative for dizziness, syncope, weakness, numbness and headaches.  Hematological: Negative for adenopathy. Does not bruise/bleed easily.  Psychiatric/Behavioral: Negative for behavioral problems, confusion and decreased concentration.   Past Medical History:  Diagnosis Date   Arthritis    Cancer ([place])    Basal cell carcinoma   Cancer (HCC)    Squamous cell carcinoma   COPD (chronic obstructive pulmonary disease) (HCC)    Coronary artery disease    Hyperlipidemia    Lipid disorder    Melanoma (HCC)    Numbness and tingling of right leg    Mild  residual   Osteoporosis    Peripheral arterial disease (HCC)    PONV (postoperative nausea and vomiting)    Rhinitis    Shortness of breath    Wheezing      Social History   Socioeconomic History   Marital status: Divorced    Spouse name: Not on file   Number of children: Not on file   Years of education: Not on file   Highest education level: Not on file  Occupational History   Not on file  Tobacco Use   Smoking status: Former Smoker    Packs/day: 1.00    Years: 50.00    Pack years: 50.00    Types: Cigarettes    Quit date: 03/17/2007    Years since quitting: 12.1   Smokeless tobacco: Never Used  Substance and Sexual Activity   Alcohol use: Never   Drug use: Never   Sexual activity: Not Currently    Birth control/protection: None  Other Topics Concern   Not on file  Social History Narrative   Not on file   Social Determinants of Health   Financial Resource Strain:    Difficulty of Paying Living Expenses: Not on file  Food Insecurity:    Worried About Charity fundraiser in the Last Year: Not on file   [employer] of Food in the Last Year: Not on file  Transportation Needs:    Lack of Transportation (Medical):  Not on file   Lack of Transportation (Non-Medical): Not on file  Physical Activity:    Days of Exercise per Week: Not on file   Minutes of Exercise per Session: Not on file  Stress:    Feeling of Stress : Not on file  Social Connections:    Frequency of Communication with Friends and Family: Not on file   Frequency of Social Gatherings with Friends and Family: Not on file   Attends Religious Services: Not on file   Active Member of Clubs or Organizations: Not on file   Attends Archivist Meetings: Not on file   Marital Status: Not on file  Intimate Partner Violence:    Fear of Current or Ex-Partner: Not on file   Emotionally Abused: Not on file   Physically Abused: Not on file   Sexually Abused: Not on file     Past Surgical History:  Procedure Laterality Date   ANGIOPLASTY     Arteriography     COLONOSCOPY     EYE SURGERY     Laser vision correction   FASCIOTOMY  03/2017   4 compartment.   FEMORAL-POPLITEAL BYPASS GRAFT  03/2017   with Prosthetic for ciritcal lim ischemia, 2/2 thrombosis of existing graft.   Iliofemoral Endarterectomy Right    SKIN BIOPSY     SPINE SURGERY     Fusion, Lumbar   THROMBECTOMY FEMORAL ARTERY  03/2017    Family History  Problem Relation Age of Onset   Hyperlipidemia Mother    Pneumonia Father    Hyperlipidemia Sister    Hyperlipidemia Brother    Osteoporosis Sister     Allergies  Allergen Reactions   Penicillins     "Mouth broke out in sores"   Prednisone Diarrhea    Current Outpatient Medications on File Prior to Visit  Medication Sig Dispense Refill   apixaban (ELIQUIS) 2.5 MG TABS tablet Take 1 tablet (2.5 mg total) by mouth 2 (two) times daily. 60 tablet 5   aspirin EC 81 MG tablet Take 81 mg by mouth daily.     atorvastatin (LIPITOR) 80 MG tablet Take 1 tablet (80 mg total) by mouth daily. 90 tablet 3   Riboflavin (VITAMIN B-2 PO) Take 1 tablet by mouth once a week.     Vitamin D, Ergocalciferol, (DRISDOL) 1.25 MG (50000 UT) CAPS capsule Take 1 capsule (50,000 Units total) by mouth every 7 (seven) days. 8 capsule 2   No current facility-administered medications on file prior to visit.    BP 136/77 (BP Location: Left Arm, Cuff Size: Normal)    Pulse 81    Temp (!) 97.2 F (36.2 C) (Temporal)    Resp 12    Ht 5\' 2"  (1.575 m)    Wt 115 lb 6.4 oz (52.3 kg)    SpO2 100%    BMI 21.11 kg/m       Objective:   Physical Exam  General Mental Status- Alert. General Appearance- Not in acute distress.   Skin General: Color- Normal Color. Moisture- Normal Moisture. No rash under her breast.  Breast- symmetric. Left breast tender and feels like possible mass under nipple. Rt breast nontender and no mass.   Anterior  thorax- no pain on palpation.  Neck Carotid Arteries- Normal color. Moisture- Normal Moisture. No carotid bruits. No JVD.  Chest and Lung Exam Auscultation: Breath Sounds:-Normal.  Cardiovascular Auscultation:Rythm- Regular. Murmurs & Other Heart Sounds:Auscultation of the heart reveals- No Murmurs.  Abdomen Inspection:-Inspeection Normal. Palpation/Percussion:Note:No mass. Palpation  and Percussion of the abdomen reveal- Non Tender, Non Distended + BS, no rebound or guarding.    Neurologic Cranial Nerve exam:- CN III-XII intact(No nystagmus), symmetric smile. Strength:- 5/5 equal and symmetric strength both upper and lower extremities.      Assessment & Plan:  [phone]  You do have recent left-sided breast pain.  On exam the pain is evident and I do think I feel a mass/lump underneath your left nipple.  We will go ahead and put in diagnostic mammogram order.  During the interim you can use Tylenol for pain as you have some rib region pain that could be a muscular type pain as well.  Also concerned that you might have some costochondritis pain.  However you cannot take NSAIDs due to use of Eliquis.  You had 2-second episode of left side costochondral junction region pain.  Your EKG showed sinus rhythm with some possible T wave changes in V1 and V2.  I do not have an old EKG to compare.  Your pain is very atypical but in light of your EKG would recommend ED evaluation in the event of recurrent persisting type pain.  Presently we will go ahead and do 1 stat troponin since the pain resolved after 2 seconds and low suspicion.  We will also go ahead and get left rib series/chest as you do have some rib pain and you also have history of smoking.  Follow-up in 7 to 10 days or as needed.  40+ minutes spent with patient today.  50% of time spent counseling patient on plan going forward.  Mackie Pai, PA-C

## 2019-05-01 ENCOUNTER — Encounter: Payer: Self-pay | Admitting: Medical

## 2019-05-04 ENCOUNTER — Other Ambulatory Visit: Payer: Self-pay | Admitting: Medical

## 2019-05-04 DIAGNOSIS — N644 Mastodynia: Secondary | ICD-10-CM

## 2019-05-04 DIAGNOSIS — N63 Unspecified lump in unspecified breast: Secondary | ICD-10-CM

## 2019-05-04 NOTE — Telephone Encounter (Signed)
Per referrals whom we referred her they will call and get her scheduled.

## 2019-05-06 ENCOUNTER — Ambulatory Visit
Admission: RE | Admit: 2019-05-06 | Discharge: 2019-05-06 | Disposition: A | Discharge status: Home / Self Care | Payer: Medicare Other | Source: Ambulatory Visit | Attending: Medical | Admitting: Medical

## 2019-05-06 ENCOUNTER — Other Ambulatory Visit: Payer: Self-pay

## 2019-05-06 DIAGNOSIS — N644 Mastodynia: Secondary | ICD-10-CM

## 2019-05-06 DIAGNOSIS — N63 Unspecified lump in unspecified breast: Secondary | ICD-10-CM

## 2019-05-07 ENCOUNTER — Telehealth: Payer: Self-pay | Admitting: Medical

## 2019-05-07 ENCOUNTER — Encounter: Payer: Self-pay | Admitting: Medical

## 2019-05-07 DIAGNOSIS — R0789 Other chest pain: Secondary | ICD-10-CM

## 2019-05-07 NOTE — Telephone Encounter (Signed)
Accidentally opened.

## 2019-05-07 NOTE — Telephone Encounter (Signed)
cardiolgoist referral placed.

## 2019-05-07 NOTE — Telephone Encounter (Signed)
Cardiologist referral placed.

## 2019-05-13 ENCOUNTER — Encounter: Payer: Self-pay | Admitting: Cardiology

## 2019-05-13 ENCOUNTER — Ambulatory Visit (INDEPENDENT_AMBULATORY_CARE_PROVIDER_SITE_OTHER): Payer: Medicare Other | Admitting: Cardiology

## 2019-05-13 ENCOUNTER — Other Ambulatory Visit: Payer: Self-pay

## 2019-05-13 VITALS — BP 140/88 | HR 89 | Ht 62.0 in | Wt 115.0 lb

## 2019-05-13 DIAGNOSIS — I739 Peripheral vascular disease, unspecified: Secondary | ICD-10-CM | POA: Diagnosis not present

## 2019-05-13 DIAGNOSIS — Z87891 Personal history of nicotine dependence: Secondary | ICD-10-CM

## 2019-05-13 DIAGNOSIS — R0789 Other chest pain: Secondary | ICD-10-CM | POA: Insufficient documentation

## 2019-05-13 HISTORY — DX: Other chest pain: R07.89

## 2019-05-13 HISTORY — DX: Personal history of nicotine dependence: Z87.891

## 2019-05-13 NOTE — Patient Instructions (Signed)
Medication Instructions:  Your physician recommends that you continue on your current medications as directed. Please refer to the Current Medication list given to you today.  *If you need a refill on your cardiac medications before your next appointment, please call your pharmacy*  Lab Work: None.  If you have labs (blood work) drawn today and your tests are completely normal, you will receive your results only by: Marland Kitchen MyChart Message (if you have MyChart) OR . A paper copy in the mail If you have any lab test that is abnormal or we need to change your treatment, we will call you to review the results.  Testing/Procedures: None.   Follow-Up: At [facility], you and your health needs are our priority.  As part of our continuing mission to provide you with exceptional heart care, we have created designated Provider Care Teams.  These Care Teams include your primary Cardiologist (physician) and Advanced Practice Providers (APPs -  Physician Assistants and Nurse Practitioners) who all work together to provide you with the care you need, when you need it.  Your next appointment:   4 week(s)  The format for your next appointment:   In Person  Provider:   Jenne Campus, MD  Other Instructions

## 2019-05-13 NOTE — Progress Notes (Signed)
Cardiology Consultation:    Date:  05/13/2019   ID:  Randa Ngo Dragan, DOB [birth year], MRN [id number]  PCP:  Mackie Pai, PA-C  Cardiologist:  Jenne Campus, MD   Referring MD: Elise Benne   No chief complaint on file. I have a chest pain  History of Present Illness:    Holly Briggs is a 85 y.o. female who is being seen today for the evaluation of chest pain at the request of [place], Percell Miller, [place].  She is a lady with quite incredible peripheral vascular disease history.  She recently moved into the area from [facility].  Her past medical history include femoral to popliteal bypass in 2010, then about 2 years ago she presented emergently to the hospital with occluded femoral to popliteal bypass and underwent emergent, deep femoral thrombectomy with right femoral to below-knee popliteal bypass with Goretex.  She also underwent 4 compartment fasciotomy.  She eventually healed completely and she was discharged home.  She is on Eliquis as well as aspirin.  She could very drink understanding what happened to her leg.  All the information above are from note by vascular surgeon who seen her recently.  I will request office records to get more detail.  She denies having any heart trouble.  Denies having any intervention done on her heart.  She recalls having some test on her heart and apparently everything was fine. She was referred to [place] because of chest pain pain is very atypical pain is located under her left breast pain is worse with pressing the area.  Pain is also better when she is wearing her bra.  She did have mammogram done just few days ago and apparently everything was well.  Denies having exertional chest pain tightness squeezing pressure burning chest.  Past Medical History:  Diagnosis Date  . Arthritis   . Cancer (HCC)    Basal cell carcinoma  . Cancer (HCC)    Squamous cell carcinoma  . COPD (chronic obstructive pulmonary  disease) ([place])   . Coronary artery disease   . Hyperlipidemia   . Lipid disorder   . Melanoma ([place])   . Numbness and tingling of right leg    Mild residual  . Osteoporosis   . Peripheral arterial disease ([place])   . PONV (postoperative nausea and vomiting)   . Rhinitis   . Shortness of breath   . Wheezing     Past Surgical History:  Procedure Laterality Date  . ANGIOPLASTY    . Arteriography    . COLONOSCOPY    . EYE SURGERY     Laser vision correction  . FASCIOTOMY  03/2017   4 compartment.  . FEMORAL-POPLITEAL BYPASS GRAFT  03/2017   with Prosthetic for ciritcal lim ischemia, 2/2 thrombosis of existing graft.  . Iliofemoral Endarterectomy Right   . SKIN BIOPSY    . SPINE SURGERY     Fusion, Lumbar  . THROMBECTOMY FEMORAL ARTERY  03/2017    Current Medications: Current Meds  Medication Sig  . apixaban (ELIQUIS) 2.5 MG TABS tablet Take 1 tablet (2.5 mg total) by mouth 2 (two) times daily.  Marland Kitchen aspirin EC 81 MG tablet Take 81 mg by mouth daily.  Marland Kitchen atorvastatin (LIPITOR) 80 MG tablet Take 1 tablet (80 mg total) by mouth daily.  . Riboflavin (VITAMIN B-2 PO) Take 1 tablet by mouth once a week.  . Vitamin D, Ergocalciferol, (DRISDOL) 1.25 MG (50000 UT) CAPS capsule Take 1 capsule (50,000 Units total)  by mouth every 7 (seven) days.     Allergies:   Penicillins and Prednisone   Social History   Socioeconomic History  . Marital status: Divorced    Spouse name: Not on file  . Number of children: Not on file  . Years of education: Not on file  . Highest education level: Not on file  Occupational History  . Not on file  Tobacco Use  . Smoking status: Former Smoker    Packs/day: 1.00    Years: 50.00    Pack years: 50.00    Types: Cigarettes    Quit date: 03/17/2007    Years since quitting: 12.1  . Smokeless tobacco: Never Used  Substance and Sexual Activity  . Alcohol use: Never  . Drug use: Never  . Sexual activity: Not Currently    Birth control/protection: None   Other Topics Concern  . Not on file  Social History Narrative  . Not on file   Social Determinants of Health   Financial Resource Strain:   . Difficulty of Paying Living Expenses: Not on file  Food Insecurity:   . Worried About Charity fundraiser in the Last Year: Not on file  . Ran Out of Food in the Last Year: Not on file  Transportation Needs:   . Lack of Transportation (Medical): Not on file  . Lack of Transportation (Non-Medical): Not on file  Physical Activity:   . Days of Exercise per Week: Not on file  . Minutes of Exercise per Session: Not on file  Stress:   . Feeling of Stress : Not on file  Social Connections:   . Frequency of Communication with Friends and Family: Not on file  . Frequency of Social Gatherings with Friends and Family: Not on file  . Attends Religious Services: Not on file  . Active Member of Clubs or Organizations: Not on file  . Attends Archivist Meetings: Not on file  . Marital Status: Not on file     Family History: The patient's family history includes Hyperlipidemia in her brother, mother, and sister; Osteoporosis in her sister; Pneumonia in her father. ROS:   Please see the history of present illness.    All 14 point review of systems negative except as described per history of present illness.  EKGs/Labs/Other Studies Reviewed:    The following studies were reviewed today:   EKG:  EKG is  ordered today.  The ekg ordered today demonstrates EKG done by primary care physician showed normal sinus rhythm, potentially left atrial enlargement, nonspecific ST segment changes  Recent Labs: 03/26/2019: ALT 20; BUN 16; Creatinine, Ser 0.97; Hemoglobin 12.7; Platelets 261.0; Potassium 5.0; Sodium 142  Recent Lipid Panel    Component Value Date/Time   CHOL 148 03/26/2019 0749   TRIG 93.0 03/26/2019 0749   HDL 53.70 03/26/2019 0749   CHOLHDL 3 03/26/2019 0749   VLDL 18.6 03/26/2019 0749   LDLCALC 76 03/26/2019 0749    Physical  Exam:    VS:  BP 140/88   Pulse 89   Ht 5\' 2"  (1.575 m)   Wt 115 lb (52.2 kg)   SpO2 98%   BMI 21.03 kg/m     Wt Readings from Last 3 Encounters:  05/13/19 115 lb (52.2 kg)  04/30/19 115 lb 6.4 oz (52.3 kg)  04/22/19 114 lb (51.7 kg)     GEN:  Well nourished, well developed in no acute distress HEENT: Normal NECK: No JVD; No carotid bruits LYMPHATICS: No  lymphadenopathy CARDIAC: RRR, no murmurs, no rubs, no gallops RESPIRATORY:  Clear to auscultation without rales, wheezing or rhonchi  ABDOMEN: Soft, non-tender, non-distended MUSCULOSKELETAL:  No edema; No deformity her last posterior tibial arteries palpable.  In the right lower extremity cannot palpate SKIN: Warm and dry NEUROLOGIC:  Alert and oriented x 3 PSYCHIATRIC:  Normal affect   ASSESSMENT:    1. Peripheral vascular disease ([place])   2. Atypical chest pain   3. History of smoking    PLAN:    In order of problems listed above:  1. Peripheral vascular disease quite advanced she is already on Eliquis as well as aspirin which I will continue.  She is being followed by vascular surgeon. 2. Atypical chest pain looks very nonspecific and worse with pressing not related to exercise help , pain improved while she is wearing prior overall very nonspecific characteristic.  However, because of her extensive vascular problem we will probably end up doing some cardiac work-up as well she tells me that she did have something done by her cardiologist from [place] I will try to get records and see what was done I would anticipate to do in the future if nothing was done recently will be stress testing.  However again the pain she described to me today is very atypical. 3. History of smoking likely she quit I encouraged her to stay away from smoking 4. Dyslipidemia she is taking high intensity statin which I will continue.  Last cholesterol profile that I have is from January with LDL of 76 and HDL of 53.  Will add Zetia to her  medical regimen.  Her LDL need to be less than 70.   Medication Adjustments/Labs and Tests Ordered: Current medicines are reviewed at length with the patient today.  Concerns regarding medicines are outlined above.  No orders of the defined types were placed in this encounter.  No orders of the defined types were placed in this encounter.   Signed, Park Liter, MD, [facility]. 05/13/2019 12:53 PM    [place]

## 2019-05-19 ENCOUNTER — Ambulatory Visit: Payer: Medicare Other | Admitting: Rheumatology

## 2019-05-21 ENCOUNTER — Ambulatory Visit: Payer: Self-pay | Admitting: Rheumatology

## 2019-05-27 ENCOUNTER — Ambulatory Visit (INDEPENDENT_AMBULATORY_CARE_PROVIDER_SITE_OTHER): Payer: Medicare Other | Admitting: Family Medicine

## 2019-05-27 ENCOUNTER — Encounter: Payer: Self-pay | Admitting: Family Medicine

## 2019-05-27 ENCOUNTER — Other Ambulatory Visit: Payer: Self-pay

## 2019-05-27 VITALS — BP 147/90 | HR 88 | Ht 62.0 in | Wt 120.0 lb

## 2019-05-27 DIAGNOSIS — M545 Low back pain, unspecified: Secondary | ICD-10-CM

## 2019-05-27 DIAGNOSIS — M7062 Trochanteric bursitis, left hip: Secondary | ICD-10-CM | POA: Diagnosis not present

## 2019-05-27 NOTE — Assessment & Plan Note (Addendum)
Likely has an imbalance with the postural changes related to the back. -Referral to physical therapy. -Could consider joint injection

## 2019-05-27 NOTE — Assessment & Plan Note (Addendum)
Acute on chronic in nature.  Likely has to do with some of her spinal changes as well as her likely osteopenia. -Counseled on home exercise therapy and supportive care. -Referral to physical therapy. -Provided samples of Glucerna and counseled on diet. -Discussed getting a bone density scan.

## 2019-05-27 NOTE — Progress Notes (Signed)
Holly Briggs - 85 y.o. female MRN [id number]  Date of birth: 17-Nov-1934  SUBJECTIVE:  Including CC & ROS.  Chief Complaint  Patient presents with  . Follow-up    follow up for left hip and low back    Holly Briggs is a 85 y.o. female that is following up for her left hip pain and her ongoing low back pain.  She did get some improvement with the previous injection.  The pain is worse through the course of the day.  She does stretches that she has learned from previous physical therapy visits.  The pain of the lower back is acute on chronic.  She has history of lumbar fusion.  Pain can be severe at times.  She can walk normally if she leans over the cart while shopping.  Her posture seems to get worse with the course of the day.  She may eat lunch through the course the day and may have breakfast or supper..  Independent review of the left hip x-ray from 1/7 shows no significant degenerative changes.   Review of Systems See HPI   HISTORY: Past Medical, Surgical, Social, and Family History Reviewed & Updated per EMR.   Pertinent Historical Findings include:  Past Medical History:  Diagnosis Date  . Arthritis   . Cancer (HCC)    Basal cell carcinoma  . Cancer (HCC)    Squamous cell carcinoma  . COPD (chronic obstructive pulmonary disease) ([place])   . Coronary artery disease   . Hyperlipidemia   . Lipid disorder   . Melanoma ([place])   . Numbness and tingling of right leg    Mild residual  . Osteoporosis   . Peripheral arterial disease ([place])   . PONV (postoperative nausea and vomiting)   . Rhinitis   . Shortness of breath   . Wheezing     Past Surgical History:  Procedure Laterality Date  . ANGIOPLASTY    . Arteriography    . COLONOSCOPY    . EYE SURGERY     Laser vision correction  . FASCIOTOMY  03/2017   4 compartment.  . FEMORAL-POPLITEAL BYPASS GRAFT  03/2017   with Prosthetic for ciritcal lim ischemia, 2/2 thrombosis of existing graft.  .  Iliofemoral Endarterectomy Right   . SKIN BIOPSY    . SPINE SURGERY     Fusion, Lumbar  . THROMBECTOMY FEMORAL ARTERY  03/2017    Family History  Problem Relation Age of Onset  . Hyperlipidemia Mother   . Pneumonia Father   . Hyperlipidemia Sister   . Hyperlipidemia Brother   . Osteoporosis Sister     Social History   Socioeconomic History  . Marital status: Divorced    Spouse name: Not on file  . Number of children: Not on file  . Years of education: Not on file  . Highest education level: Not on file  Occupational History  . Not on file  Tobacco Use  . Smoking status: Former Smoker    Packs/day: 1.00    Years: 50.00    Pack years: 50.00    Types: Cigarettes    Quit date: 03/17/2007    Years since quitting: 12.2  . Smokeless tobacco: Never Used  Substance and Sexual Activity  . Alcohol use: Never  . Drug use: Never  . Sexual activity: Not Currently    Birth control/protection: None  Other Topics Concern  . Not on file  Social History Narrative  . Not on file   Social  Determinants of Health   Financial Resource Strain:   . Difficulty of Paying Living Expenses: Not on file  Food Insecurity:   . Worried About Charity fundraiser in the Last Year: Not on file  . Ran Out of Food in the Last Year: Not on file  Transportation Needs:   . Lack of Transportation (Medical): Not on file  . Lack of Transportation (Non-Medical): Not on file  Physical Activity:   . Days of Exercise per Week: Not on file  . Minutes of Exercise per Session: Not on file  Stress:   . Feeling of Stress : Not on file  Social Connections:   . Frequency of Communication with Friends and Family: Not on file  . Frequency of Social Gatherings with Friends and Family: Not on file  . Attends Religious Services: Not on file  . Active Member of Clubs or Organizations: Not on file  . Attends Archivist Meetings: Not on file  . Marital Status: Not on file  Intimate Partner Violence:   .  Fear of Current or Ex-Partner: Not on file  . Emotionally Abused: Not on file  . Physically Abused: Not on file  . Sexually Abused: Not on file     PHYSICAL EXAM:  VS: BP (!) 147/90   Pulse 88   Ht 5\' 2"  (1.575 m)   Wt 120 lb (54.4 kg)   BMI 21.95 kg/m  Physical Exam Gen: NAD, alert, cooperative with exam, well-appearing MSK:  Back/ Left hip: Obvious change when she stands of the back or posture. Tenderness to palpation of the greater trochanter. Has good internal and external rotation. Neurovascularly intact      ASSESSMENT & PLAN:   Acute bilateral low back pain without sciatica Acute on chronic in nature.  Likely has to do with some of her spinal changes as well as her likely osteopenia. -Counseled on home exercise therapy and supportive care. -Referral to physical therapy. -Provided samples of Glucerna and counseled on diet. -Discussed getting a bone density scan.   Trochanteric bursitis of left hip Likely has an imbalance with the postural changes related to the back. -Referral to physical therapy. -Could consider joint injection

## 2019-05-27 NOTE — Patient Instructions (Signed)
Good to see you  Please continue tylenol  Please continue heat  Please try the exercises  Physical therapy will give you a call  Please send me a message in MyChart with any questions or updates.  Please see me back in 2-3 months.   --Dr. Raeford Razor

## 2019-06-01 ENCOUNTER — Ambulatory Visit: Payer: Medicare Other | Admitting: Podiatry

## 2019-06-01 ENCOUNTER — Other Ambulatory Visit: Payer: Self-pay

## 2019-06-02 ENCOUNTER — Other Ambulatory Visit: Payer: Self-pay

## 2019-06-02 ENCOUNTER — Ambulatory Visit (INDEPENDENT_AMBULATORY_CARE_PROVIDER_SITE_OTHER): Payer: Medicare Other | Admitting: Medical

## 2019-06-02 VITALS — BP 148/75 | HR 70 | Temp 96.5°F | Resp 18 | Ht 62.0 in | Wt 117.2 lb

## 2019-06-02 DIAGNOSIS — R0781 Pleurodynia: Secondary | ICD-10-CM | POA: Diagnosis not present

## 2019-06-02 DIAGNOSIS — R739 Hyperglycemia, unspecified: Secondary | ICD-10-CM | POA: Diagnosis not present

## 2019-06-02 LAB — COMPREHENSIVE METABOLIC PANEL
ALT: 26 U/L (ref 0–35)
AST: 31 U/L (ref 0–37)
Albumin: 4.1 g/dL (ref 3.5–5.2)
Alkaline Phosphatase: 71 U/L (ref 39–117)
BUN: 20 mg/dL (ref 6–23)
CO2: 28 mEq/L (ref 19–32)
Calcium: 9.4 mg/dL (ref 8.4–10.5)
Chloride: 106 mEq/L (ref 96–112)
Creatinine, Ser: 0.92 mg/dL (ref 0.40–1.20)
GFR: 70.66 mL/min (ref >60.00)
Glucose, Bld: 105 mg/dL — ABNORMAL HIGH (ref 70–99)
Potassium: 4.8 mEq/L (ref 3.5–5.1)
Sodium: 142 mEq/L (ref 135–145)
Total Bilirubin: 0.7 mg/dL (ref 0.2–1.2)
Total Protein: 6.5 g/dL (ref 6.0–8.3)

## 2019-06-02 LAB — HEMOGLOBIN A1C: Hgb A1c MFr Bld: 5.7 % (ref 4.6–6.5)

## 2019-06-02 MED ORDER — METHYLPREDNISOLONE 4 MG PO TABS: ORAL_TABLET | ORAL | 0 refills | Status: DC

## 2019-06-02 NOTE — Patient Instructions (Addendum)
You do appear to direct pain over rib/cartlige area. Xray was negative, mammogram was negative and your pain does not appear cardiac at this point. Follow up with cardiologist is pending.  Will get get cmp and a1c due to your hx of elevated sugar.  After review of labs will update you and then determine if you can start medrol rx.  If you have change in signs/symptoms cardiac like then be seen in ED.  Follow up date to be determined 10-14 days.

## 2019-06-02 NOTE — Progress Notes (Signed)
Subjective:    Patient ID: Holly Briggs, female    DOB: Sep 21, 1934, 85 y.o.   MRN: [id number]  HPI  Pt in for follow up.  Pt had some pain in left side breast/chest region pain. Pt had negative mammogram. Pt had evaluation by cardiologist end of February.   Pt has no pain on presently. She notes when wears bra she has no pain. Without bra will have pain. When rolls over in bed and changes position will have pain. She points to left side costochondral junction area as source of pain.   Below is cardiologist notes from end of February visit note. Pt has follow up  With cardiologist end of this month.   In order of problems listed above:  1. Peripheral vascular disease quite advanced she is already on Eliquis as well as aspirin which I will continue.  She is being followed by vascular surgeon. 2. Atypical chest pain looks very nonspecific and worse with pressing not related to exercise help , pain improved while she is wearing prior overall very nonspecific characteristic.  However, because of her extensive vascular problem we will probably end up doing some cardiac work-up as well she tells me that she did have something done by her cardiologist from [place] I will try to get records and see what was done I would anticipate to do in the future if nothing was done recently will be stress testing.  However again the pain she described to me today is very atypical. 3. History of smoking likely she quit I encouraged her to stay away from smoking 4. Dyslipidemia she is taking high intensity statin which I will continue.  Last cholesterol profile that I have is from January with LDL of 76 and HDL of 53.  Will add Zetia to her medical regimen.  Her LDL need to be less than 70.  Pt points to left lower rib beneath breast as source of pain.    Review of Systems  Constitutional: Negative for chills.  HENT: Negative for congestion and drooling.   Respiratory: Negative for cough,  chest tightness, shortness of breath and wheezing.   Cardiovascular: Negative for chest pain and palpitations.       No mid sternal chest pain. No associated cardiac symptoms. Pain today direct tender over rib area under her breast.  Gastrointestinal: Negative for abdominal pain, nausea and vomiting.  Musculoskeletal: Negative for back pain.       Left lower rib pain. See comments under cardio.  Neurological: Negative for dizziness, speech difficulty and light-headedness.  Hematological: Negative for adenopathy. Does not bruise/bleed easily.  Psychiatric/Behavioral: Negative for behavioral problems and suicidal ideas. The patient is not nervous/anxious.     Past Medical History:  Diagnosis Date  . Arthritis   . Cancer (HCC)    Basal cell carcinoma  . Cancer (HCC)    Squamous cell carcinoma  . COPD (chronic obstructive pulmonary disease) ([place])   . Coronary artery disease   . Hyperlipidemia   . Lipid disorder   . Melanoma ([place])   . Numbness and tingling of right leg    Mild residual  . Osteoporosis   . Peripheral arterial disease ([place])   . PONV (postoperative nausea and vomiting)   . Rhinitis   . Shortness of breath   . Wheezing      Social History   Socioeconomic History  . Marital status: Divorced    Spouse name: Not on file  . Number of children: Not  on file  . Years of education: Not on file  . Highest education level: Not on file  Occupational History  . Not on file  Tobacco Use  . Smoking status: Former Smoker    Packs/day: 1.00    Years: 50.00    Pack years: 50.00    Types: Cigarettes    Quit date: 03/17/2007    Years since quitting: 12.2  . Smokeless tobacco: Never Used  Substance and Sexual Activity  . Alcohol use: Never  . Drug use: Never  . Sexual activity: Not Currently    Birth control/protection: None  Other Topics Concern  . Not on file  Social History Narrative  . Not on file   Social Determinants of Health   Financial Resource Strain:     . Difficulty of Paying Living Expenses:   Food Insecurity:   . Worried About Charity fundraiser in the Last Year:   . Arboriculturist in the Last Year:   Transportation Needs:   . Film/video editor (Medical):   Marland Kitchen Lack of Transportation (Non-Medical):   Physical Activity:   . Days of Exercise per Week:   . Minutes of Exercise per Session:   Stress:   . Feeling of Stress :   Social Connections:   . Frequency of Communication with Friends and Family:   . Frequency of Social Gatherings with Friends and Family:   . Attends Religious Services:   . Active Member of Clubs or Organizations:   . Attends Archivist Meetings:   Marland Kitchen Marital Status:   Intimate Partner Violence:   . Fear of Current or Ex-Partner:   . Emotionally Abused:   Marland Kitchen Physically Abused:   . Sexually Abused:     Past Surgical History:  Procedure Laterality Date  . ANGIOPLASTY    . Arteriography    . COLONOSCOPY    . EYE SURGERY     Laser vision correction  . FASCIOTOMY  03/2017   4 compartment.  . FEMORAL-POPLITEAL BYPASS GRAFT  03/2017   with Prosthetic for ciritcal lim ischemia, 2/2 thrombosis of existing graft.  . Iliofemoral Endarterectomy Right   . SKIN BIOPSY    . SPINE SURGERY     Fusion, Lumbar  . THROMBECTOMY FEMORAL ARTERY  03/2017    Family History  Problem Relation Age of Onset  . Hyperlipidemia Mother   . Pneumonia Father   . Hyperlipidemia Sister   . Hyperlipidemia Brother   . Osteoporosis Sister     Allergies  Allergen Reactions  . Latex   . Penicillins     "Mouth broke out in sores"  . Prednisone Diarrhea    Current Outpatient Medications on File Prior to Visit  Medication Sig Dispense Refill  . apixaban (ELIQUIS) 2.5 MG TABS tablet Take 1 tablet (2.5 mg total) by mouth 2 (two) times daily. 60 tablet 5  . aspirin EC 81 MG tablet Take 81 mg by mouth daily.    Marland Kitchen atorvastatin (LIPITOR) 80 MG tablet Take 1 tablet (80 mg total) by mouth daily. 90 tablet 3  .  Riboflavin (VITAMIN B-2 PO) Take 1 tablet by mouth once a week.    . Vitamin D, Ergocalciferol, (DRISDOL) 1.25 MG (50000 UT) CAPS capsule Take 1 capsule (50,000 Units total) by mouth every 7 (seven) days. 8 capsule 2   No current facility-administered medications on file prior to visit.    BP (!) 148/75 (BP Location: Right Arm, Patient Position: Sitting, Cuff Size: Normal)  Pulse 70   Temp (!) 96.5 F (35.8 C) (Temporal)   Resp 18   Ht 5\' 2"  (1.575 m)   Wt 117 lb 3.2 oz (53.2 kg)   SpO2 99%   BMI 21.44 kg/m      Objective:   Physical Exam  General Mental Status- Alert. General Appearance- Not in acute distress.   Skin General: Color- Normal Color. Moisture- Normal Moisture.  Neck Carotid Arteries- Normal color. Moisture- Normal Moisture. No carotid bruits. No JVD.  Chest and Lung Exam Auscultation: Breath Sounds:-Normal.  Cardiovascular Auscultation:Rythm- Regular. Murmurs & Other Heart Sounds:Auscultation of the heart reveals- No Murmurs.  Abdomen Inspection:-Inspeection Normal. Palpation/Percussion:Note:No mass. Palpation and Percussion of the abdomen reveal- Non Tender, Non Distended + BS, no rebound or guarding.   Neurologic Cranial Nerve exam:- CN III-XII intact(No nystagmus), symmetric smile. Strength:- 5/5 equal and symmetric strength both upper and lower extremities.  Anterior thorax- left lower rib pain area of costochondral junction. Pain on palpation reproducible.     Assessment & Plan:  You do appear to direct pain over rib/cartlige area. Xray was negative, mammogram was negative and your pain does not appear cardiac at this point. Follow up with cardiologist is pending.  Will get get cmp and a1c due to your hx of elevated sugar.  After review of labs will update you and then determine if you can start medrol rx.  If you have change in signs/symptoms cardiac like then be seen in ED.  Follow up date to be determined 10-14 days.   30 minutes  spent with pt today.  Mackie Pai, PA-C

## 2019-06-03 ENCOUNTER — Telehealth: Payer: Self-pay | Admitting: Medical

## 2019-06-03 ENCOUNTER — Encounter: Payer: Self-pay | Admitting: Medical

## 2019-06-03 ENCOUNTER — Ambulatory Visit: Payer: Medicare Other | Attending: Family Medicine | Admitting: Physical Therapy

## 2019-06-03 ENCOUNTER — Encounter: Payer: Self-pay | Admitting: Physical Therapy

## 2019-06-03 DIAGNOSIS — M25552 Pain in left hip: Secondary | ICD-10-CM | POA: Diagnosis present

## 2019-06-03 DIAGNOSIS — R293 Abnormal posture: Secondary | ICD-10-CM | POA: Diagnosis present

## 2019-06-03 DIAGNOSIS — M6281 Muscle weakness (generalized): Secondary | ICD-10-CM

## 2019-06-03 DIAGNOSIS — M545 Low back pain, unspecified: Secondary | ICD-10-CM

## 2019-06-03 DIAGNOSIS — R29898 Other symptoms and signs involving the musculoskeletal system: Secondary | ICD-10-CM | POA: Diagnosis present

## 2019-06-03 DIAGNOSIS — G8929 Other chronic pain: Secondary | ICD-10-CM | POA: Diagnosis present

## 2019-06-03 NOTE — Therapy (Signed)
[facility] [place] [address]  [place], [place], [address] Phone: [phone]   Fax:  [phone]  Physical Therapy Evaluation  Patient Details  Name: Holly Briggs MRN: [id number] Date of Birth: 1934-06-02 Referring Provider (PT): Clearance Coots, MD   Encounter Date: 06/03/2019  PT End of Session - 06/03/19 0925    Visit Number  1    Number of Visits  12    Date for PT Re-Evaluation  07/15/19    Authorization Type  Medicare & AARP    PT Start Time  [phone]    PT Stop Time  1013    PT Time Calculation (min)  48 min    Activity Tolerance  Patient tolerated treatment well;Patient limited by pain    Behavior During Therapy  [facility] for tasks assessed/performed       Past Medical History:  Diagnosis Date  . Arthritis   . Cancer (HCC)    Basal cell carcinoma  . Cancer (HCC)    Squamous cell carcinoma  . COPD (chronic obstructive pulmonary disease) ([place])   . Coronary artery disease   . Hyperlipidemia   . Lipid disorder   . Melanoma ([place])   . Numbness and tingling of right leg    Mild residual  . Osteoporosis   . Peripheral arterial disease ([place])   . PONV (postoperative nausea and vomiting)   . Rhinitis   . Shortness of breath   . Wheezing     Past Surgical History:  Procedure Laterality Date  . ANGIOPLASTY    . Arteriography    . COLONOSCOPY    . EYE SURGERY     Laser vision correction  . FASCIOTOMY  03/2017   4 compartment.  . FEMORAL-POPLITEAL BYPASS GRAFT  03/2017   with Prosthetic for ciritcal lim ischemia, 2/2 thrombosis of existing graft.  . Iliofemoral Endarterectomy Right   . SKIN BIOPSY    . SPINE SURGERY     Fusion, Lumbar  . THROMBECTOMY FEMORAL ARTERY  03/2017    There were no vitals filed for this visit.   Subjective Assessment - 06/03/19 0928    Subjective  Pt reports she has been going to PT for 10 yrs on and off for osteoporosis and progressive scoliosis, but has not kept  up with exercises over the long term. She states she can feel when her spine is shifting. The spine problems have led to B hip pain. Hip will feel better briefly after hip injections, but pain comes back as severe as before once injections wear off.    Pertinent History  extensive PAD with R LE blood clot (03/2017) requiring thrombectomy of femoral artery and femoral-popliteal BPG complicated by repeat thrombosis requiring 4 compartment fasciotomy and several month hospitalization (R LE remains edematous and sensitive to touch with residual numbness and tingling), osteoporosis, OA, remote h/o lumbar fusion, CAD, COPD, skin cancer    Limitations  Sitting;Lifting;Standing;Walking;House hold activities    How long can you sit comfortably?  "I don't know"    How long can you stand comfortably?  "I don't know"    How long can you walk comfortably?  "I don't know"    Diagnostic tests  L hip x-ray 03/26/19: The bones appear osteopenic. No acute fracture or dislocation. Mild degenerative changes are identified within both hip joints.    Patient Stated Goals  "to relieve the pain, I guess ... it's not going to happen"    Currently in Pain?  Yes    Pain Score  2    up to "100/10" on a bad day   Pain Location  Back    Pain Orientation  Lower    Pain Descriptors / Indicators  --   "tremendous" when I try to turn over in bed   Pain Type  Chronic pain    Pain Radiating Towards  denies    Pain Onset  Other (comment)   >10 yrs   Pain Frequency  Constant   varies in intensity   Aggravating Factors   repositioning or laying straight in bed    Pain Relieving Factors  Tylenol, heating pad    Effect of Pain on Daily Activities  "I can't answer that"    Multiple Pain Sites  Yes    Pain Score  0   "100/10"   Pain Location  Hip    Pain Orientation  Left    Pain Type  Chronic pain    Pain Radiating Towards  denies    Pain Onset  --   6-8 months   Pain Frequency  Intermittent   varies in intensity    Aggravating Factors   sit to stand    Pain Relieving Factors  steroid injections    Effect of Pain on Daily Activities  "I don't know"         [facility] PT Assessment - 06/03/19 0925      Assessment   Medical Diagnosis  Acute on chronic LBP; L hip trochanteric bursitis    Referring Provider (PT)  Clearance Coots, MD    Onset Date/Surgical Date  --   chronic   Next MD Visit  08/27/19    Prior Therapy  multiple PT epsiodes over past 10 years      Precautions   Precautions  Back      Balance Screen   Has the patient fallen in the past 6 months  No    Has the patient had a decrease in activity level because of a fear of falling?   No    Is the patient reluctant to leave their home because of a fear of falling?   No      Home Environment   Living Environment  Private residence    Living Arrangements  Alone   3 cats   Available Help at Discharge  Family   nearby   Type of [place] to enter    Entrance Stairs-Number of Steps  1    [place]  One level      Prior Function   Level of [place]  Retired      Associate Professor   Overall Cognitive Status  Within Functional Limits for tasks assessed      Posture/Postural Control   Posture/Postural Control  Postural limitations    Postural Limitations  Rounded Shoulders;Forward head;Increased thoracic kyphosis;Decreased lumbar lordosis;Flexed trunk    Posture Comments  advanced kyphoscoliosis      ROM / Strength   AROM / PROM / Strength  AROM;Strength      AROM   AROM Assessment Site  Lumbar    Lumbar Flexion  hands to lower shins - R rib hump    Lumbar Extension  unable     Lumbar - Right Side Bend  hand to femoral condyle - pain    Lumbar - Left Side Bend  hand to femoral condyle  Lumbar - Right Rotation  75% limited    Lumbar - Left Rotation  75% limited      Strength   Overall Strength Comments  tested in sitting - limited tolerance for MMT on R LE d/t h/o  blood clots    Strength Assessment Site  Hip;Knee;Ankle    Right/Left Hip  Right;Left    Right Hip Flexion  4/5    Right Hip Extension  3+/5    Right Hip External Rotation   4-/5    Right Hip Internal Rotation  4-/5    Right Hip ABduction  4/5    Right Hip ADduction  4/5    Left Hip Flexion  4-/5   pain   Left Hip Extension  3+/5    Left Hip External Rotation  3+/5    Left Hip Internal Rotation  3+/5    Left Hip ABduction  4/5    Left Hip ADduction  4/5    Right/Left Knee  Right;Left    Right Knee Flexion  4/5    Right Knee Extension  4/5    Left Knee Flexion  4+/5    Left Knee Extension  4+/5    Right/Left Ankle  Right;Left    Right Ankle Dorsiflexion  4/5    Left Ankle Dorsiflexion  4+/5      Flexibility   Soft Tissue Assessment /Muscle Length  yes    Hamstrings  mild/mod tight B    ITB  mild tight B    Piriformis  severely tight B    Quadratus Lumborum  mod tight B                Objective measurements completed on examination: See above findings.      [facility] Adult PT Treatment/Exercise - 06/03/19 0925      Lumbar Exercises: Stretches   Passive Hamstring Stretch  Left;10 seconds;1 rep    Passive Hamstring Stretch Limitations  supine with strap - limited tolerance due to pain    Single Knee to Chest Stretch  Left;Right;30 seconds;1 rep    Single Knee to Chest Stretch Limitations  opp LE straight    Lower Trunk Rotation  30 seconds;2 reps    ITB Stretch  Left;10 seconds;1 rep    ITB Stretch Limitations  supine with strap - poor tolerance due to pain on R side    Piriformis Stretch  Left;30 seconds;2 reps    Piriformis Stretch Limitations  supine KTOS with opp LE straight      Lumbar Exercises: Supine   Ab Set  10 reps;5 seconds    Pelvic Tilt Limitations  attempted but deferred d/t increased pain             PT Education - 06/03/19 1013    Education Details  PT eval findings, anticipated POC and initial HEP    Person(s) Educated  Patient     Methods  Explanation;Demonstration;Verbal cues;Tactile cues;Handout    Comprehension  Verbalized understanding;Returned demonstration;Verbal cues required;Tactile cues required;Need further instruction       PT Short Term Goals - 06/03/19 1013      PT SHORT TERM GOAL #1   Title  Patient will be independent with initial HEP    Status  New    Target Date  06/24/19        PT Long Term Goals - 06/03/19 1013      PT LONG TERM GOAL #1   Title  Patient will be independent with ongoing/advanced HEP  Status  New    Target Date  07/15/19      PT LONG TERM GOAL #2   Title  Patient to demonstrate appropriate posture and body mechanics needed for daily activities    Status  New    Target Date  07/15/19      PT LONG TERM GOAL #3   Title  Patient to report pain reduction in frequency and intensity by >/= 50%    Status  New    Target Date  07/15/19      PT LONG TERM GOAL #4   Title  Patient will demonstrate improved B proximal LE strength to >/= 4/5 for improved stability and ease of mobility    Status  New    Target Date  07/15/19      PT LONG TERM GOAL #5   Title  Patient to report ability to perform mobilty, ADLs and household tasks with decreased pain interference    Status  New    Target Date  07/15/19             Plan - 06/03/19 1013    Clinical Impression Statement  Sahori is an 85 y/o female who presents to OP PT for L trochanteric bursitis and acute on chronic B LBP secondary to advanced kyphoscoliosis. She has been receiving PT intermittently for these problems over the past 10 years but has not keep up with prior HEP(s). Patient attributes hip pain to spinal degeneration and states she can feel when her spine is shifting, although she notes her spine is currently stable. She has difficulty identifying aggravating factors for pain as well as pain impact on daily functional activities. Deficits include constant B LBP and intermittent L hip pain, severe postural  abnormalities due to kyphoscoliosis, limited trunk/lumbar ROM in all directions with inability to extend trunk beyond neutral, limited B hip ROM with reduced lumbopelvic muscle flexibility, and decreased core and B LE strength. Karthika will benefit from skilled PT services to address above impairments and allow for performance of normal daily activities with decreased pain interference.    Personal Factors and Comorbidities  Comorbidity 3+;Time since onset of injury/illness/exacerbation;Fitness;Age;Past/Current Experience    Comorbidities  extensive PAD with R LE blood clot (03/2017) requiring thrombectomy of femoral artery and femoral-popliteal BPG complicated by repeat thrombosis requiring 4 compartment fasciotomy and several month hospitalization (R LE remains edematous and sensitive to touch with residual numbness and tingling), osteoporosis, OA, remote h/o lumbar fusion, CAD, COPD, skin cancer    Examination-Activity Limitations  Bed Mobility;Bend;Carry;Lift;Locomotion Level;Sit;Sleep;Squat;Stairs;Stand;Transfers    Examination-Participation Restrictions  Cleaning;Community Activity;Laundry;Meal Prep;Shop    Stability/Clinical Decision Making  Unstable/Unpredictable    Clinical Decision Making  High    Rehab Potential  Fair    PT Frequency  2x / week    PT Duration  6 weeks    PT Treatment/Interventions  ADLs/Self Care Home Management;Cryotherapy;Electrical Stimulation;Moist Heat;Ultrasound;DME Instruction;Gait training;Functional mobility training;Therapeutic activities;Therapeutic exercise;Neuromuscular re-education;Patient/family education;Manual techniques;Passive range of motion;Dry needling;Taping    PT Next Visit Plan  Review initial HEP, progress lumbopelvic flexibility and strengthening as tolerated, posture and body mechanics education, manual therapy and modalities PRN    PT Home Exercise Plan  06/03/19 - SKTC, KTOS & LTR stretches, abdominal bracing    Consulted and Agree with Plan of Care   Patient       Patient will benefit from skilled therapeutic intervention in order to improve the following deficits and impairments:  Abnormal gait, Decreased activity tolerance, Decreased balance, Decreased endurance,  Decreased knowledge of precautions, Decreased mobility, Decreased range of motion, Decreased safety awareness, Decreased strength, Difficulty walking, Increased edema, Increased fascial restricitons, Increased muscle spasms, Impaired perceived functional ability, Impaired flexibility, Improper body mechanics, Postural dysfunction, Pain  Visit Diagnosis: Chronic bilateral low back pain without sciatica - Plan: PT plan of care cert/re-cert  Pain in left hip - Plan: PT plan of care cert/re-cert  Abnormal posture - Plan: PT plan of care cert/re-cert  Muscle weakness (generalized) - Plan: PT plan of care cert/re-cert  Other symptoms and signs involving the musculoskeletal system - Plan: PT plan of care cert/re-cert     Problem List Patient Active Problem List   Diagnosis Date Noted  . Atypical chest pain 05/13/2019  . History of smoking 05/13/2019  . Peripheral vascular disease ([place]) 04/22/2019  . 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  . Acute bilateral low back pain without sciatica 04/22/2019  . Onychomadesis of toenail 04/22/2019  . Trochanteric bursitis of left hip 03/26/2019    Percival Spanish, PT, MPT 06/03/2019, 2:29 PM  [facility] [address]  [address] [place], [place], [address] Phone: [phone]   Fax:  [phone]  Name: Kayron Schaan Messineo MRN: [id number] Date of Birth: 07/04/1934

## 2019-06-03 NOTE — Telephone Encounter (Signed)
Would you like to see pt before xray is dropped ?

## 2019-06-03 NOTE — Telephone Encounter (Signed)
Opened to review 

## 2019-06-03 NOTE — Patient Instructions (Signed)
    Home exercise program created by Miko Markwood, PT.  For questions, please contact Novak Stgermaine via phone at [phone] or email at [email or web address]  [facility] [place] [address]  [address] [place], , [address] Phone: [phone]   Fax:  [phone]    

## 2019-06-03 NOTE — Telephone Encounter (Signed)
Patient is requesting a call back from Provider.    Patient call back # [phone]

## 2019-06-08 ENCOUNTER — Ambulatory Visit: Payer: Medicare Other

## 2019-06-08 ENCOUNTER — Other Ambulatory Visit: Payer: Self-pay

## 2019-06-08 DIAGNOSIS — G8929 Other chronic pain: Secondary | ICD-10-CM

## 2019-06-08 DIAGNOSIS — M25552 Pain in left hip: Secondary | ICD-10-CM

## 2019-06-08 DIAGNOSIS — M545 Low back pain, unspecified: Secondary | ICD-10-CM

## 2019-06-08 DIAGNOSIS — R29898 Other symptoms and signs involving the musculoskeletal system: Secondary | ICD-10-CM

## 2019-06-08 DIAGNOSIS — M6281 Muscle weakness (generalized): Secondary | ICD-10-CM

## 2019-06-08 DIAGNOSIS — R293 Abnormal posture: Secondary | ICD-10-CM

## 2019-06-08 NOTE — Patient Instructions (Signed)

## 2019-06-08 NOTE — Therapy (Signed)
[facility] [address]  [place], [place], [address] Phone: [phone]   Fax:  [phone]  Physical Therapy Treatment  Patient Details  Name: Holly Briggs MRN: [id number] Date of Birth: [birth year] Referring Provider (PT): Clearance Coots, MD   Encounter Date: 06/08/2019  PT End of Session - 06/08/19 0852    Visit Number  2    Number of Visits  12    Date for PT Re-Evaluation  07/15/19    Authorization Type  Medicare & AARP    PT Start Time  [phone]    PT Stop Time  0939   Ended visit with 10 min moist heat   PT Time Calculation (min)  58 min    Activity Tolerance  Patient tolerated treatment well;Patient limited by pain    Behavior During Therapy  [facility] for tasks assessed/performed       Past Medical History:  Diagnosis Date  . Arthritis   . Cancer (HCC)    Basal cell carcinoma  . Cancer (HCC)    Squamous cell carcinoma  . COPD (chronic obstructive pulmonary disease) ([place])   . Coronary artery disease   . Hyperlipidemia   . Lipid disorder   . Melanoma ([place])   . Numbness and tingling of right leg    Mild residual  . Osteoporosis   . Peripheral arterial disease ([place])   . PONV (postoperative nausea and vomiting)   . Rhinitis   . Shortness of breath   . Wheezing     Past Surgical History:  Procedure Laterality Date  . ANGIOPLASTY    . Arteriography    . COLONOSCOPY    . EYE SURGERY     Laser vision correction  . FASCIOTOMY  03/2017   4 compartment.  . FEMORAL-POPLITEAL BYPASS GRAFT  03/2017   with Prosthetic for ciritcal lim ischemia, 2/2 thrombosis of existing graft.  . Iliofemoral Endarterectomy Right   . SKIN BIOPSY    . SPINE SURGERY     Fusion, Lumbar  . THROMBECTOMY FEMORAL ARTERY  03/2017    There were no vitals filed for this visit.  Subjective Assessment - 06/08/19 0847    Subjective  Pt. feels she got some relief with HEP stretches.    Pertinent History  extensive  PAD with R LE blood clot (03/2017) requiring thrombectomy of femoral artery and femoral-popliteal BPG complicated by repeat thrombosis requiring 4 compartment fasciotomy and several month hospitalization (R LE remains edematous and sensitive to touch with residual numbness and tingling), osteoporosis, OA, remote h/o lumbar fusion, CAD, COPD, skin cancer    Diagnostic tests  L hip x-ray 03/26/19: The bones appear osteopenic. No acute fracture or dislocation. Mild degenerative changes are identified within both hip joints.    Patient Stated Goals  "to relieve the pain, I guess ... it's not going to happen"    Currently in Pain?  Yes    Pain Score  4     Pain Location  Back    Pain Orientation  Lower                       [facility] Adult PT Treatment/Exercise - 06/08/19 0001      Self-Care   Self-Care  Other Self-Care Comments    Other Self-Care Comments   Instructed pt. in proper posture and body mechanics with daily activities as to reduce lumbar strain;  focused on posture, sitting positioning, and need for changing  positions frequently as pt. admits to sitting for a few hours a day reading and sometimes falls asleep sitting and reading      Lumbar Exercises: Stretches   Single Knee to Chest Stretch  Left;Right;30 seconds;2 reps    Single Knee to Chest Stretch Limitations  opp LE straight    Lower Trunk Rotation  30 seconds;2 reps    Piriformis Stretch  Right;Left;2 reps;30 seconds    Piriformis Stretch Limitations  supine KTOS with opp LE straight      Lumbar Exercises: Aerobic   Nustep  Lvl 1, 6 min (LE only)      Lumbar Exercises: Supine   Ab Set  10 reps;5 seconds    AB Set Limitations  cues for proper contraction       Modalities   Modalities  Moist Heat      Moist Heat Therapy   Number Minutes Moist Heat  10 Minutes    Moist Heat Location  Lumbar Spine   in sitting with LE resting on 4" step              PT Short Term Goals - 06/08/19 [id number]      PT SHORT  TERM GOAL #1   Title  Patient will be independent with initial HEP    Status  On-going    Target Date  06/24/19        PT Long Term Goals - 06/08/19 [id number]      PT LONG TERM GOAL #1   Title  Patient will be independent with ongoing/advanced HEP    Status  On-going      PT LONG TERM GOAL #2   Title  Patient to demonstrate appropriate posture and body mechanics needed for daily activities    Status  On-going      PT LONG TERM GOAL #3   Title  Patient to report pain reduction in frequency and intensity by >/= 50%    Status  On-going      PT LONG TERM GOAL #4   Title  Patient will demonstrate improved B proximal LE strength to >/= 4/5 for improved stability and ease of mobility    Status  On-going      PT LONG TERM GOAL #5   Title  Patient to report ability to perform mobilty, ADLs and household tasks with decreased pain interference    Status  On-going            Plan - 06/08/19 0853    Clinical Impression Statement  Holly Briggs reports she found some relief from HEP stretches.  Only required min cueing with HEP activities for proper pressure with LE stretches as to avoid excessive lumbar strain.  Pt. did complaint intermittently of lumbar pain while in elevated hooklying positioning and performing LE stretching which recovered quickly with rest.  Instructed pt. in proper posture and body mechanics focusing on positioning with reading as pt. spending ~ 1-2 hours reading/day.  Pt. admits to prolonged sitting with reading at times falling asleep in chair with some pain upon rising afterwards.  Encouraged pt. to change positions frequently and be mindful of forward hunched positioning when reading as to reduce lumbar strain.  Ended visit with trial of moist heat to lumbar spine to relax musculature and reduce pain as pt. noting good relief from this at home.  Pt. noting good relief after 10 min moist heat in sitting to end visit.    Comorbidities  extensive PAD with R LE blood clot (  03/2017)  requiring thrombectomy of femoral artery and femoral-popliteal BPG complicated by repeat thrombosis requiring 4 compartment fasciotomy and several month hospitalization (R LE remains edematous and sensitive to touch with residual numbness and tingling), osteoporosis, OA, remote h/o lumbar fusion, CAD, COPD, skin cancer    Rehab Potential  Fair    PT Treatment/Interventions  ADLs/Self Care Home Management;Cryotherapy;Electrical Stimulation;Moist Heat;Ultrasound;DME Instruction;Gait training;Functional mobility training;Therapeutic activities;Therapeutic exercise;Neuromuscular re-education;Patient/family education;Manual techniques;Passive range of motion;Dry needling;Taping    PT Next Visit Plan  Progress lumbopelvic flexibility and strengthening as tolerated, posture and body mechanics education, manual therapy and modalities PRN    PT Home Exercise Plan  06/03/19 - SKTC, KTOS & LTR stretches, abdominal bracing    Consulted and Agree with Plan of Care  Patient       Patient will benefit from skilled therapeutic intervention in order to improve the following deficits and impairments:  Abnormal gait, Decreased activity tolerance, Decreased balance, Decreased endurance, Decreased knowledge of precautions, Decreased mobility, Decreased range of motion, Decreased safety awareness, Decreased strength, Difficulty walking, Increased edema, Increased fascial restricitons, Increased muscle spasms, Impaired perceived functional ability, Impaired flexibility, Improper body mechanics, Postural dysfunction, Pain  Visit Diagnosis: Chronic bilateral low back pain without sciatica  Pain in left hip  Abnormal posture  Muscle weakness (generalized)  Other symptoms and signs involving the musculoskeletal system     Problem List Patient Active Problem List   Diagnosis Date Noted  . Atypical chest pain 05/13/2019  . History of smoking 05/13/2019  . Peripheral vascular disease ([place]) 04/22/2019  . Squamous  cell carcinoma in situ (SCCIS) of skin 04/22/2019  . Acute bilateral low back pain without sciatica 04/22/2019  . Onychomadesis of toenail 04/22/2019  . Trochanteric bursitis of left hip 03/26/2019    Bess Harvest, PTA 06/08/19 12:00 PM   [facility] [address]  [place], [place], [address] Phone: [phone]   Fax:  [phone]  Name: Holly Briggs MRN: [id number] Date of Birth: 1935-02-02

## 2019-06-09 ENCOUNTER — Ambulatory Visit: Payer: Medicare Other | Admitting: Rheumatology

## 2019-06-10 ENCOUNTER — Other Ambulatory Visit: Payer: Self-pay

## 2019-06-10 ENCOUNTER — Ambulatory Visit: Payer: Medicare Other

## 2019-06-10 DIAGNOSIS — G8929 Other chronic pain: Secondary | ICD-10-CM

## 2019-06-10 DIAGNOSIS — M6281 Muscle weakness (generalized): Secondary | ICD-10-CM

## 2019-06-10 DIAGNOSIS — R293 Abnormal posture: Secondary | ICD-10-CM

## 2019-06-10 DIAGNOSIS — M25552 Pain in left hip: Secondary | ICD-10-CM

## 2019-06-10 DIAGNOSIS — M545 Low back pain, unspecified: Secondary | ICD-10-CM

## 2019-06-10 DIAGNOSIS — R29898 Other symptoms and signs involving the musculoskeletal system: Secondary | ICD-10-CM

## 2019-06-10 NOTE — Therapy (Signed)
[facility] [address]  [place], [place], [address] Phone: [phone]   Fax:  [phone]  Physical Therapy Treatment  Patient Details  Name: Holly Briggs MRN: [id number] Date of Birth: 06-11-1934 Referring Provider (PT): Clearance Coots, MD   Encounter Date: 06/10/2019  PT End of Session - 06/10/19 0859    Visit Number  3    Number of Visits  12    Date for PT Re-Evaluation  07/15/19    Authorization Type  Medicare & AARP    PT Start Time  0848    PT Stop Time  0939   Ended visit with 10 min moist heat   PT Time Calculation (min)  51 min    Activity Tolerance  Patient tolerated treatment well;Patient limited by pain    Behavior During Therapy  [facility] for tasks assessed/performed       Past Medical History:  Diagnosis Date  . Acute bilateral low back pain without sciatica 04/22/2019  . Arthritis   . Atypical chest pain 05/13/2019  . Cancer (HCC)    Basal cell carcinoma  . Cancer (HCC)    Squamous cell carcinoma  . COPD (chronic obstructive pulmonary disease) ([place])   . Coronary artery disease   . History of smoking 05/13/2019  . Hyperlipidemia   . Lipid disorder   . Melanoma ([place])   . Numbness and tingling of right leg    Mild residual  . Onychomadesis of toenail 04/22/2019  . Osteoporosis   . Peripheral arterial disease ([place])   . Peripheral vascular disease ([place]) 04/22/2019  . PONV (postoperative nausea and vomiting)   . Rhinitis   . Shortness of breath   . 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  . Trochanteric bursitis of left hip 03/26/2019  . Wheezing     Past Surgical History:  Procedure Laterality Date  . ANGIOPLASTY    . Arteriography    . COLONOSCOPY    . EYE SURGERY     Laser vision correction  . FASCIOTOMY  03/2017   4 compartment.  . FEMORAL-POPLITEAL BYPASS GRAFT  03/2017   with Prosthetic for ciritcal lim ischemia, 2/2 thrombosis of existing graft.  .  Iliofemoral Endarterectomy Right   . SKIN BIOPSY    . SPINE SURGERY     Fusion, Lumbar  . THROMBECTOMY FEMORAL ARTERY  03/2017    There were no vitals filed for this visit.  Subjective Assessment - 06/10/19 0851    Subjective  Pt. reporting she is still having consistent hip pain.    Pertinent History  extensive PAD with R LE blood clot (03/2017) requiring thrombectomy of femoral artery and femoral-popliteal BPG complicated by repeat thrombosis requiring 4 compartment fasciotomy and several month hospitalization (R LE remains edematous and sensitive to touch with residual numbness and tingling), osteoporosis, OA, remote h/o lumbar fusion, CAD, COPD, skin cancer    Diagnostic tests  L hip x-ray 03/26/19: The bones appear osteopenic. No acute fracture or dislocation. Mild degenerative changes are identified within both hip joints.    Patient Stated Goals  "to relieve the pain, I guess ... it's not going to happen"    Currently in Pain?  Yes    Pain Score  5    up to 8-9/10 pain at worst   Pain Location  Back    Pain Orientation  Lower    Pain Descriptors / Indicators  --   " Excruciating "  Pain Type  Chronic pain    Pain Frequency  Constant    Pain Score  5    Pain Location  Hip    Pain Orientation  Left                       [facility] Adult PT Treatment/Exercise - 06/10/19 0001      Lumbar Exercises: Stretches   Single Knee to Chest Stretch  Left;Right;30 seconds;2 reps    Single Knee to Chest Stretch Limitations  opp LE straight    Lower Trunk Rotation Limitations  5" x 10 reps       Lumbar Exercises: Aerobic   Nustep  Lvl 1, 6 min (LE only)      Lumbar Exercises: Seated   Sit to Stand  5 reps    Sit to Stand Limitations  pushing from table       Knee/Hip Exercises: Seated   Ball Squeeze  5" x 10 reps     Clamshell with TheraBand  Yellow   3" x 10 rpes    Marching  Right;Left;10 reps;Strengthening    Marching Limitations  seated       Modalities    Modalities  Moist Heat      Moist Heat Therapy   Number Minutes Moist Heat  10 Minutes    Moist Heat Location  Lumbar Spine   sitting in chair with 4" step            PT Education - 06/10/19 1225    Education Details  HEP update;  seated march, seated adduction squeeze, seated yellow TB clam shell, sit<>stand (UE push)    Person(s) Educated  Patient    Methods  Explanation;Demonstration;Verbal cues;Handout    Comprehension  Verbalized understanding;Returned demonstration;Verbal cues required       PT Short Term Goals - 06/08/19 0852      PT SHORT TERM GOAL #1   Title  Patient will be independent with initial HEP    Status  On-going    Target Date  06/24/19        PT Long Term Goals - 06/08/19 0852      PT LONG TERM GOAL #1   Title  Patient will be independent with ongoing/advanced HEP    Status  On-going      PT LONG TERM GOAL #2   Title  Patient to demonstrate appropriate posture and body mechanics needed for daily activities    Status  On-going      PT LONG TERM GOAL #3   Title  Patient to report pain reduction in frequency and intensity by >/= 50%    Status  On-going      PT LONG TERM GOAL #4   Title  Patient will demonstrate improved B proximal LE strength to >/= 4/5 for improved stability and ease of mobility    Status  On-going      PT LONG TERM GOAL #5   Title  Patient to report ability to perform mobilty, ADLs and household tasks with decreased pain interference    Status  On-going            Plan - 06/10/19 1226    Clinical Impression Statement  Holly Briggs reporting she is getting some pain relief from LTR HEP activity.  Progressed seated LE/proximal hip strengthening activities today with pt. agreeable to HEP updated with this activity for home performance.  Still with intermittent "shots" of LBP with lumbopelvic motion during therex  which does not seem to have predictable pattern.  HEP updated today with handout issued to pt. and yellow TB issued  to pt. for clam shell in seated position.  Ended visit with continued moist heat to lumbar spine in seated position as pt. noting excellent relief from this last session.  Will monitor updated HEP adherence and tolerance in coming session.    Comorbidities  extensive PAD with R LE blood clot (03/2017) requiring thrombectomy of femoral artery and femoral-popliteal BPG complicated by repeat thrombosis requiring 4 compartment fasciotomy and several month hospitalization (R LE remains edematous and sensitive to touch with residual numbness and tingling), osteoporosis, OA, remote h/o lumbar fusion, CAD, COPD, skin cancer    Rehab Potential  Fair    PT Frequency  2x / week    PT Treatment/Interventions  ADLs/Self Care Home Management;Cryotherapy;Electrical Stimulation;Moist Heat;Ultrasound;DME Instruction;Gait training;Functional mobility training;Therapeutic activities;Therapeutic exercise;Neuromuscular re-education;Patient/family education;Manual techniques;Passive range of motion;Dry needling;Taping    PT Next Visit Plan  Progress lumbopelvic flexibility and strengthening as tolerated, posture and body mechanics education, manual therapy and modalities PRN    PT Home Exercise Plan  06/03/19 - SKTC, KTOS & LTR stretches, abdominal bracing; 06/10/19 - seated march, seated adduction squeeze, seated yellow TB clam shell, sit<>stand (UE push)    Consulted and Agree with Plan of Care  Patient       Patient will benefit from skilled therapeutic intervention in order to improve the following deficits and impairments:  Abnormal gait, Decreased activity tolerance, Decreased balance, Decreased endurance, Decreased knowledge of precautions, Decreased mobility, Decreased range of motion, Decreased safety awareness, Decreased strength, Difficulty walking, Increased edema, Increased fascial restricitons, Increased muscle spasms, Impaired perceived functional ability, Impaired flexibility, Improper body mechanics, Postural  dysfunction, Pain  Visit Diagnosis: Chronic bilateral low back pain without sciatica  Pain in left hip  Abnormal posture  Muscle weakness (generalized)  Other symptoms and signs involving the musculoskeletal system     Problem List Patient Active Problem List   Diagnosis Date Noted  . Atypical chest pain 05/13/2019  . History of smoking 05/13/2019  . Peripheral vascular disease ([place]) 04/22/2019  . 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  . Acute bilateral low back pain without sciatica 04/22/2019  . Onychomadesis of toenail 04/22/2019  . Trochanteric bursitis of left hip 03/26/2019    Bess Harvest, PTA 06/10/19 12:31 PM   [facility] [address]  [place], [place], [address] Phone: [phone]   Fax:  [phone]  Name: Holly Briggs MRN: [id number] Date of Birth: 1934-12-27

## 2019-06-11 ENCOUNTER — Ambulatory Visit (INDEPENDENT_AMBULATORY_CARE_PROVIDER_SITE_OTHER): Payer: Medicare Other | Admitting: Cardiology

## 2019-06-11 ENCOUNTER — Encounter: Payer: Self-pay | Admitting: Cardiology

## 2019-06-11 VITALS — BP 142/82 | HR 88 | Temp 97.8°F | Ht 62.0 in | Wt 115.1 lb

## 2019-06-11 DIAGNOSIS — Z87891 Personal history of nicotine dependence: Secondary | ICD-10-CM

## 2019-06-11 DIAGNOSIS — I739 Peripheral vascular disease, unspecified: Secondary | ICD-10-CM

## 2019-06-11 DIAGNOSIS — R0789 Other chest pain: Secondary | ICD-10-CM | POA: Diagnosis not present

## 2019-06-11 DIAGNOSIS — E785 Hyperlipidemia, unspecified: Secondary | ICD-10-CM | POA: Diagnosis not present

## 2019-06-11 NOTE — Patient Instructions (Addendum)
Medication Instructions:  Your physician recommends that you continue on your current medications as directed. Please refer to the Current Medication list given to you today.  *If you need a refill on your cardiac medications before your next appointment, please call your pharmacy*   Lab Work: Your physician recommends that you return for lab work today: lipids   If you have labs (blood work) drawn today and your tests are completely normal, you will receive your results only by: Marland Kitchen MyChart Message (if you have MyChart) OR . A paper copy in the mail If you have any lab test that is abnormal or we need to change your treatment, we will call you to review the results.   Testing/Procedures:  Your physician has requested that you have a carotid duplex. This test is an ultrasound of the carotid arteries in your neck. It looks at blood flow through these arteries that supply the brain with blood. Allow one hour for this exam. There are no restrictions or special instructions.     [facility] at [facility] [address], [place], [place] [address] Phone: [phone]    Please arrive 15 minutes prior to your appointment time for registration and insurance purposes.  The test will take approximately 3 to 4 hours to complete; you may bring reading material.  If someone comes with you to your appointment, they will need to remain in the main lobby due to limited space in the testing area. **If you are pregnant or breastfeeding, please notify the nuclear lab prior to your appointment**  How to prepare for your Myocardial Perfusion Test: . Do not eat or drink 3 hours prior to your test, except you may have water. . Do not consume products containing caffeine (regular or decaffeinated) 12 hours prior to your test. (ex: coffee, chocolate, sodas, tea). . Do bring a list of your current medications with you.  If not listed below, you may take your  medications as normal. . Do wear comfortable clothes (no dresses or overalls) and walking shoes, tennis shoes preferred (No heels or open toe shoes are allowed). . Do NOT wear cologne, perfume, aftershave, or lotions (deodorant is allowed). . If these instructions are not followed, your test will have to be rescheduled.  Please report to [address] for your test.  If you have questions or concerns about your appointment, you can call the Nuclear Lab at [phone].  If you cannot keep your appointment, please provide 24 hours notification to the Nuclear Lab, to avoid a possible $50 charge to your account.    Follow-Up: At [facility], you and your health needs are our priority.  As part of our continuing mission to provide you with exceptional heart care, we have created designated Provider Care Teams.  These Care Teams include your primary Cardiologist (physician) and Advanced Practice Providers (APPs -  Physician Assistants and Nurse Practitioners) who all work together to provide you with the care you need, when you need it.  We recommend signing up for the patient portal called "MyChart".  Sign up information is provided on this After Visit Summary.  MyChart is used to connect with patients for Virtual Visits (Telemedicine).  Patients are able to view lab/test results, encounter notes, upcoming appointments, etc.  Non-urgent messages can be sent to your provider as well.   To learn more about what you can do with MyChart, go to [email or web address].    Your next appointment:  2 month(s)  The format for your next appointment:   In Person  Provider:   Jenne Campus, MD   Other Instructions   Cardiac Nuclear Scan A cardiac nuclear scan is a test that measures blood flow to the heart when a person is resting and when he or she is exercising. The test looks for problems such as:  Not enough blood reaching a portion of the heart.  The heart muscle not  working normally. You may need this test if:  You have heart disease.  You have had abnormal lab results.  You have had heart surgery or a balloon procedure to open up blocked arteries (angioplasty).  You have chest pain.  You have shortness of breath. In this test, a radioactive dye (tracer) is injected into your bloodstream. After the tracer has traveled to your heart, an imaging device is used to measure how much of the tracer is absorbed by or distributed to various areas of your heart. This procedure is usually done at a hospital and takes 2-4 hours. Tell a health care provider about:  Any allergies you have.  All medicines you are taking, including vitamins, herbs, eye drops, creams, and over-the-counter medicines.  Any problems you or family members have had with anesthetic medicines.  Any blood disorders you have.  Any surgeries you have had.  Any medical conditions you have.  Whether you are pregnant or may be pregnant. What are the risks? Generally, this is a safe procedure. However, problems may occur, including:  Serious chest pain and heart attack. This is only a risk if the stress portion of the test is done.  Rapid heartbeat.  Sensation of warmth in your chest. This usually passes quickly.  Allergic reaction to the tracer. What happens before the procedure?  Ask your health care provider about changing or stopping your regular medicines. This is especially important if you are taking diabetes medicines or blood thinners.  Follow instructions from your health care provider about eating or drinking restrictions.  Remove your jewelry on the day of the procedure. What happens during the procedure?  An IV will be inserted into one of your veins.  Your health care provider will inject a small amount of radioactive tracer through the IV.  You will wait for 20-40 minutes while the tracer travels through your bloodstream.  Your heart activity will be  monitored with an electrocardiogram (ECG).  You will lie down on an exam table.  Images of your heart will be taken for about 15-20 minutes.  You may also have a stress test. For this test, one of the following may be done: ? You will exercise on a treadmill or stationary bike. While you exercise, your heart's activity will be monitored with an ECG, and your blood pressure will be checked. ? You will be given medicines that will increase blood flow to parts of your heart. This is done if you are unable to exercise.  When blood flow to your heart has peaked, a tracer will again be injected through the IV.  After 20-40 minutes, you will get back on the exam table and have more images taken of your heart.  Depending on the type of tracer used, scans may need to be repeated 3-4 hours later.  Your IV line will be removed when the procedure is over. The procedure may vary among health care providers and hospitals. What happens after the procedure?  Unless your health care provider tells you otherwise, you may return  to your normal schedule, including diet, activities, and medicines.  Unless your health care provider tells you otherwise, you may increase your fluid intake. This will help to flush the contrast dye from your body. Drink enough fluid to keep your urine pale yellow.  Ask your health care provider, or the department that is doing the test: ? When will my results be ready? ? How will I get my results? Summary  A cardiac nuclear scan measures the blood flow to the heart when a person is resting and when he or she is exercising.  Tell your health care provider if you are pregnant.  Before the procedure, ask your health care provider about changing or stopping your regular medicines. This is especially important if you are taking diabetes medicines or blood thinners.  After the procedure, unless your health care provider tells you otherwise, increase your fluid intake. This will  help flush the contrast dye from your body.  After the procedure, unless your health care provider tells you otherwise, you may return to your normal schedule, including diet, activities, and medicines. This information is not intended to replace advice given to you by your health care provider. Make sure you discuss any questions you have with your health care provider. Document Revised: 08/19/2017 Document Reviewed: 08/19/2017 Elsevier Patient Education  [place].

## 2019-06-11 NOTE — Progress Notes (Signed)
Cardiology Office Note:    Date:  06/11/2019   ID:  Holly Briggs, DOB [birth year], MRN [id number]  PCP:  Mackie Pai, PA-C  Cardiologist:  Jenne Campus, MD    Referring MD: Elise Benne   No chief complaint on file. Am doing fine  History of Present Illness:    Holly Briggs is a 85 y.o. female  who is being seen today for the evaluation of chest pain at the request of [place], Percell Miller, [place].  She is a lady with quite incredible peripheral vascular disease history.  She recently moved into the area from [facility].  Her past medical history include femoral to popliteal bypass in 2010, then about 2 years ago she presented emergently to the hospital with occluded femoral to popliteal bypass and underwent emergent, deep femoral thrombectomy with right femoral to below-knee popliteal bypass with Goretex.  She also underwent 4 compartment fasciotomy.  She eventually healed completely and she was discharged home.  She is on Eliquis as well as aspirin.  She has a little understanding what happened to her leg.  All the information above are from note by vascular surgeon who seen her recently.  I will request office records to get more detail.  She denies having any heart trouble.  Denies having any intervention done on her heart.  She recalls having some test on her heart and apparently everything was fine. She was referred to [place] because of atypical chest pain.  Not related to exercise happen only when she does not wear a bra.  Overall looks very atypical.  However I wanted to get her records to see if she got any cardiac work-up done before finally I did receive the records which I reviewed carefully.  They were about 100 pages.  And I did not find any evaluation of her coronary arteries. She denies have any dizziness or passing out, she was sent to physical therapy for her back and legs and she is doing quite well from that aspect.  Past Medical  History:  Diagnosis Date  . Acute bilateral low back pain without sciatica 04/22/2019  . Arthritis   . Atypical chest pain 05/13/2019  . Cancer (HCC)    Basal cell carcinoma  . Cancer (HCC)    Squamous cell carcinoma  . COPD (chronic obstructive pulmonary disease) ([place])   . Coronary artery disease   . History of smoking 05/13/2019  . Hyperlipidemia   . Lipid disorder   . Melanoma ([place])   . Numbness and tingling of right leg    Mild residual  . Onychomadesis of toenail 04/22/2019  . Osteoporosis   . Peripheral arterial disease ([place])   . Peripheral vascular disease ([place]) 04/22/2019  . PONV (postoperative nausea and vomiting)   . Rhinitis   . Shortness of breath   . 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  . Trochanteric bursitis of left hip 03/26/2019  . Wheezing     Past Surgical History:  Procedure Laterality Date  . ANGIOPLASTY    . Arteriography    . COLONOSCOPY    . EYE SURGERY     Laser vision correction  . FASCIOTOMY  03/2017   4 compartment.  . FEMORAL-POPLITEAL BYPASS GRAFT  03/2017   with Prosthetic for ciritcal lim ischemia, 2/2 thrombosis of existing graft.  . Iliofemoral Endarterectomy Right   . SKIN BIOPSY    . SPINE SURGERY     Fusion, Lumbar  . THROMBECTOMY FEMORAL ARTERY  03/2017    Current Medications: Current Meds  Medication Sig  . apixaban (ELIQUIS) 2.5 MG TABS tablet Take 1 tablet (2.5 mg total) by mouth 2 (two) times daily.  Marland Kitchen aspirin EC 81 MG tablet Take 81 mg by mouth daily.  Marland Kitchen atorvastatin (LIPITOR) 80 MG tablet Take 1 tablet (80 mg total) by mouth daily.  . methylPREDNISolone (MEDROL) 4 MG tablet 4 tab po am and pm. 3 tab po am and pm 2 tab po am and pm. 1 tab po am and pm  . Riboflavin (VITAMIN B-2 PO) Take 1 tablet by mouth once a week.  . Vitamin D, Ergocalciferol, (DRISDOL) 1.25 MG (50000 UT) CAPS capsule Take 1 capsule (50,000 Units total) by mouth every 7 (seven) days.     Allergies:   Latex, Penicillins, Prednisone,  and Tape   Social History   Socioeconomic History  . Marital status: Divorced    Spouse name: Not on file  . Number of children: Not on file  . Years of education: Not on file  . Highest education level: Not on file  Occupational History  . Not on file  Tobacco Use  . Smoking status: Former Smoker    Packs/day: 1.00    Years: 50.00    Pack years: 50.00    Types: Cigarettes    Quit date: 03/17/2007    Years since quitting: 12.2  . Smokeless tobacco: Never Used  Substance and Sexual Activity  . Alcohol use: Never  . Drug use: Never  . Sexual activity: Not Currently    Birth control/protection: None  Other Topics Concern  . Not on file  Social History Narrative  . Not on file   Social Determinants of Health   Financial Resource Strain:   . Difficulty of Paying Living Expenses:   Food Insecurity:   . Worried About Charity fundraiser in the Last Year:   . Arboriculturist in the Last Year:   Transportation Needs:   . Film/video editor (Medical):   Marland Kitchen Lack of Transportation (Non-Medical):   Physical Activity:   . Days of Exercise per Week:   . Minutes of Exercise per Session:   Stress:   . Feeling of Stress :   Social Connections:   . Frequency of Communication with Friends and Family:   . Frequency of Social Gatherings with Friends and Family:   . Attends Religious Services:   . Active Member of Clubs or Organizations:   . Attends Archivist Meetings:   Marland Kitchen Marital Status:      Family History: The patient's family history includes Hyperlipidemia in her brother, mother, and sister; Osteoporosis in her sister; Pneumonia in her father. ROS:   Please see the history of present illness.    All 14 point review of systems negative except as described per history of present illness  EKGs/Labs/Other Studies Reviewed:      Recent Labs: 03/26/2019: Hemoglobin 12.7; Platelets 261.0 06/02/2019: ALT 26; BUN 20; Creatinine, Ser 0.92; Potassium 4.8; Sodium 142    Recent Lipid Panel    Component Value Date/Time   CHOL 148 03/26/2019 0749   TRIG 93.0 03/26/2019 0749   HDL 53.70 03/26/2019 0749   CHOLHDL 3 03/26/2019 0749   VLDL 18.6 03/26/2019 0749   LDLCALC 76 03/26/2019 0749    Physical Exam:    VS:  BP (!) 142/82   Pulse 88   Temp 97.8 F (36.6 C)   Ht 5\' 2"  (1.575 m)  Wt 115 lb 1.9 oz (52.2 kg)   SpO2 100%   BMI 21.06 kg/m     Wt Readings from Last 3 Encounters:  06/11/19 115 lb 1.9 oz (52.2 kg)  06/02/19 117 lb 3.2 oz (53.2 kg)  05/27/19 120 lb (54.4 kg)     GEN:  Well nourished, well developed in no acute distress HEENT: Normal NECK: No JVD; No carotid bruits LYMPHATICS: No lymphadenopathy CARDIAC: RRR, no murmurs, no rubs, no gallops RESPIRATORY:  Clear to auscultation without rales, wheezing or rhonchi, poor air entry bilaterally, some scoliosis ABDOMEN: Soft, non-tender, non-distended MUSCULOSKELETAL:  No edema; No deformity  SKIN: Warm and dry LOWER EXTREMITIES: no swelling, I can feel left dorsalis pedis.  Otherwise I do not feel pulses NEUROLOGIC:  Alert and oriented x 3 PSYCHIATRIC:  Normal affect   ASSESSMENT:    1. Peripheral vascular disease ([place])   2. History of smoking   3. Atypical chest pain   4. Dyslipidemia    PLAN:    In order of problems listed above:  1. Peripheral vascular disease: Follow-up by vascular surgeon.  She is doing well symptomatology from that point review.  She is on Eliquis as well as aspirin which I will continue.  The key is of course risk factors modifications.  I will ask her to have fasting lipid profile rechecked.  Last time I added Zetia to her Lipitor.  I would like to see her LDL less than 70. 2. Because of multiple risk factors for coronary artery disease as well as advanced peripheral vascular disease I will schedule her to have stress test.  Will do Lexiscan.  She will also be scheduled to have cardiac ultrasound to make sure she does not have any significant coronary  arterial disease. Dyslipidemia: I will check her fasting lipid profile today. 4.  History of smoking.  She is abstain from smoking and I congratulated her for this.  Medication Adjustments/Labs and Tests Ordered: Current medicines are reviewed at length with the patient today.  Concerns regarding medicines are outlined above.  No orders of the defined types were placed in this encounter.  Medication changes: No orders of the defined types were placed in this encounter.   Signed, Park Liter, MD, [facility] 06/11/2019 9:05 AM    [place]

## 2019-06-12 LAB — LIPID PANEL
Chol/HDL Ratio: 2.6 ratio (ref 0.0–4.4)
Cholesterol, Total: 138 mg/dL (ref 100–199)
HDL: 53 mg/dL (ref >39)
LDL Chol Calc (NIH): 63 mg/dL (ref 0–99)
Triglycerides: 124 mg/dL (ref 0–149)
VLDL Cholesterol Cal: 22 mg/dL (ref 5–40)

## 2019-06-17 ENCOUNTER — Other Ambulatory Visit: Payer: Self-pay

## 2019-06-17 ENCOUNTER — Ambulatory Visit: Payer: Medicare Other | Admitting: Physical Therapy

## 2019-06-17 ENCOUNTER — Encounter: Payer: Self-pay | Admitting: Physical Therapy

## 2019-06-17 DIAGNOSIS — G8929 Other chronic pain: Secondary | ICD-10-CM

## 2019-06-17 DIAGNOSIS — R293 Abnormal posture: Secondary | ICD-10-CM

## 2019-06-17 DIAGNOSIS — M545 Low back pain, unspecified: Secondary | ICD-10-CM

## 2019-06-17 DIAGNOSIS — M6281 Muscle weakness (generalized): Secondary | ICD-10-CM

## 2019-06-17 DIAGNOSIS — R29898 Other symptoms and signs involving the musculoskeletal system: Secondary | ICD-10-CM

## 2019-06-17 DIAGNOSIS — M25552 Pain in left hip: Secondary | ICD-10-CM

## 2019-06-17 NOTE — Therapy (Signed)
[facility] [place] [address]  [place], [place], [address] Phone: [phone]   Fax:  [phone]  Physical Therapy Treatment  Patient Details  Name: Holly Briggs MRN: [id number] Date of Birth: Oct 12, 1934 Referring Provider (PT): Clearance Coots, MD   Encounter Date: 06/17/2019  PT End of Session - 06/17/19 0843    Visit Number  4    Number of Visits  12    Date for PT Re-Evaluation  07/15/19    Authorization Type  Medicare & AARP    PT Start Time  [phone]    PT Stop Time  0935    PT Time Calculation (min)  52 min    Behavior During Therapy  [facility] for tasks assessed/performed       Past Medical History:  Diagnosis Date  . Acute bilateral low back pain without sciatica 04/22/2019  . Arthritis   . Atypical chest pain 05/13/2019  . Cancer (HCC)    Basal cell carcinoma  . Cancer (HCC)    Squamous cell carcinoma  . COPD (chronic obstructive pulmonary disease) ([place])   . Coronary artery disease   . History of smoking 05/13/2019  . Hyperlipidemia   . Lipid disorder   . Melanoma ([place])   . Numbness and tingling of right leg    Mild residual  . Onychomadesis of toenail 04/22/2019  . Osteoporosis   . Peripheral arterial disease ([place])   . Peripheral vascular disease ([place]) 04/22/2019  . PONV (postoperative nausea and vomiting)   . Rhinitis   . Shortness of breath   . 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  . Trochanteric bursitis of left hip 03/26/2019  . Wheezing     Past Surgical History:  Procedure Laterality Date  . ANGIOPLASTY    . Arteriography    . COLONOSCOPY    . EYE SURGERY     Laser vision correction  . FASCIOTOMY  03/2017   4 compartment.  . FEMORAL-POPLITEAL BYPASS GRAFT  03/2017   with Prosthetic for ciritcal lim ischemia, 2/2 thrombosis of existing graft.  . Iliofemoral Endarterectomy Right   . SKIN BIOPSY    . SPINE SURGERY     Fusion, Lumbar  . THROMBECTOMY FEMORAL  ARTERY  03/2017    There were no vitals filed for this visit.  Subjective Assessment - 06/17/19 0848    Subjective  Pt reports she is scheduled to have another steriod shot tomorrow. Pain worse after getting up in the middle of the night to clean up after cat got sick.  States she can hardly walk right now.    Pertinent History  extensive PAD with R LE blood clot (03/2017) requiring thrombectomy of femoral artery and femoral-popliteal BPG complicated by repeat thrombosis requiring 4 compartment fasciotomy and several month hospitalization (R LE remains edematous and sensitive to touch with residual numbness and tingling), osteoporosis, OA, remote h/o lumbar fusion, CAD, COPD, skin cancer    Diagnostic tests  L hip x-ray 03/26/19: The bones appear osteopenic. No acute fracture or dislocation. Mild degenerative changes are identified within both hip joints.    Patient Stated Goals  "to relieve the pain, I guess ... it's not going to happen"    Currently in Pain?  Yes    Pain Score  8    7 or 8/10   Pain Location  Back    Pain Orientation  Lower    Pain Descriptors / Indicators  Constant;Throbbing  Pain Type  Chronic pain    Pain Frequency  Constant    Pain Score  8   7 or 8/10   Pain Location  Hip    Pain Orientation  Left    Pain Descriptors / Indicators  Constant;Throbbing    Pain Type  Chronic pain    Pain Frequency  Constant                       [facility] Adult PT Treatment/Exercise - 06/17/19 0843      Lumbar Exercises: Aerobic   Nustep  L2 x 6 min (LE only)      Lumbar Exercises: Seated   Other Seated Lumbar Exercises  Abd bracing + yellow TB rows, horiz ABD & UE diagonals 10 x 3" each      Knee/Hip Exercises: Seated   Ball Squeeze  pillow squeeze 10 x 5"   ball too uncomfortable at BPG site   Clamshell with TheraBand  Yellow   10 x 3"   Marching  Right;Left;10 reps;Strengthening;2 sets    Marching Limitations  2nd set with looped yellow TB at knees       Modalities   Modalities  Moist Heat      Moist Heat Therapy   Moist Heat Location  Lumbar Spine   seated            PT Education - 06/17/19 0930    Education Details  HEP update - abd bracing + seated yellow TB rows, horiz ABD & UE diagonals    Person(s) Educated  Patient    Methods  Explanation;Demonstration;Handout    Comprehension  Verbalized understanding;Returned demonstration;Need further instruction       PT Short Term Goals - 06/08/19 [id number]      PT SHORT TERM GOAL #1   Title  Patient will be independent with initial HEP    Status  On-going    Target Date  06/24/19        PT Long Term Goals - 06/08/19 0852      PT LONG TERM GOAL #1   Title  Patient will be independent with ongoing/advanced HEP    Status  On-going      PT LONG TERM GOAL #2   Title  Patient to demonstrate appropriate posture and body mechanics needed for daily activities    Status  On-going      PT LONG TERM GOAL #3   Title  Patient to report pain reduction in frequency and intensity by >/= 50%    Status  On-going      PT LONG TERM GOAL #4   Title  Patient will demonstrate improved B proximal LE strength to >/= 4/5 for improved stability and ease of mobility    Status  On-going      PT LONG TERM GOAL #5   Title  Patient to report ability to perform mobilty, ADLs and household tasks with decreased pain interference    Status  On-going            Plan - 06/17/19 0933    Clinical Impression Statement  Holly Briggs reports pain worse today after getting up in the middle of the night to clean up after her cat. She admits to standing and bending/stooping to clean, therefore reviewed options for better posture and body mechanics for this type of task -  kneel position on floor or use of stool or small step ladder as temporary seat. She also admits to selective  performance of HEP exercises, stating she has just forgotten to do some exercises. HEP exercises reviewed focusing primarily on newest  additions, clarifying band and pillow placement to minimize pressure on site of R LE bypass graft. Introduced seated upper body resisted core strengthening focusing on scapular retraction and promotion of upright posture with patient very pleased with these exercises asking for band and home instructions for continued performance as part of HEP. Patient pleased that current PT approach is somewhat different than her pervious experiences with PT.    Personal Factors and Comorbidities  Comorbidity 3+;Time since onset of injury/illness/exacerbation;Fitness;Age;Past/Current Experience    Comorbidities  extensive PAD with R LE blood clot (03/2017) requiring thrombectomy of femoral artery and femoral-popliteal BPG complicated by repeat thrombosis requiring 4 compartment fasciotomy and several month hospitalization (R LE remains edematous and sensitive to touch with residual numbness and tingling), osteoporosis, OA, remote h/o lumbar fusion, CAD, COPD, skin cancer    Examination-Activity Limitations  Bed Mobility;Bend;Carry;Lift;Locomotion Level;Sit;Sleep;Squat;Stairs;Stand;Transfers    Examination-Participation Restrictions  Cleaning;Community Activity;Laundry;Meal Prep;Shop    Rehab Potential  Fair    PT Frequency  2x / week    PT Duration  6 weeks    PT Treatment/Interventions  ADLs/Self Care Home Management;Cryotherapy;Electrical Stimulation;Moist Heat;Ultrasound;DME Instruction;Gait training;Functional mobility training;Therapeutic activities;Therapeutic exercise;Neuromuscular re-education;Patient/family education;Manual techniques;Passive range of motion;Dry needling;Taping    PT Next Visit Plan  Progress lumbopelvic flexibility and strengthening as tolerated, review of posture and body mechanics education as indicated, manual therapy and modalities PRN    PT Home Exercise Plan  06/03/19 - SKTC, KTOS & LTR stretches, abdominal bracing; 06/10/19 - seated march, seated adduction squeeze, seated yellow TB clam  shell, sit<>stand (UE push); 06/17/19 - HEP update - abd bracing + seated yellow TB rows, horiz ABD & UE diagonals    Consulted and Agree with Plan of Care  Patient       Patient will benefit from skilled therapeutic intervention in order to improve the following deficits and impairments:  Abnormal gait, Decreased activity tolerance, Decreased balance, Decreased endurance, Decreased knowledge of precautions, Decreased mobility, Decreased range of motion, Decreased safety awareness, Decreased strength, Difficulty walking, Increased edema, Increased fascial restricitons, Increased muscle spasms, Impaired perceived functional ability, Impaired flexibility, Improper body mechanics, Postural dysfunction, Pain  Visit Diagnosis: Chronic bilateral low back pain without sciatica  Pain in left hip  Abnormal posture  Muscle weakness (generalized)  Other symptoms and signs involving the musculoskeletal system     Problem List Patient Active Problem List   Diagnosis Date Noted  . Dyslipidemia 06/11/2019  . Atypical chest pain 05/13/2019  . History of smoking 05/13/2019  . Peripheral vascular disease ([place]) 04/22/2019  . 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  . Acute bilateral low back pain without sciatica 04/22/2019  . Onychomadesis of toenail 04/22/2019  . Trochanteric bursitis of left hip 03/26/2019    Percival Spanish, PT, MPT 06/17/2019, 10:59 AM  [facility] [address]  [place], [place], [address] Phone: [phone]   Fax:  [phone]  Name: Holly Briggs MRN: [id number] Date of Birth: 02-Feb-1935

## 2019-06-17 NOTE — Patient Instructions (Signed)
    Home exercise program created by Kimberleigh Mehan, PT.  For questions, please contact Shauntell Iglesia via phone at [phone] or email at [email or web address]  [facility] [place] [address]  [address] [place], [place], [address] Phone: [phone]   Fax:  [phone]    

## 2019-06-18 ENCOUNTER — Ambulatory Visit: Payer: Self-pay

## 2019-06-18 ENCOUNTER — Ambulatory Visit (HOSPITAL_BASED_OUTPATIENT_CLINIC_OR_DEPARTMENT_OTHER)
Admission: RE | Admit: 2019-06-18 | Discharge: 2019-06-18 | Disposition: A | Discharge status: Home / Self Care | Payer: Medicare Other | Source: Ambulatory Visit | Attending: Cardiology | Admitting: Cardiology

## 2019-06-18 ENCOUNTER — Ambulatory Visit (INDEPENDENT_AMBULATORY_CARE_PROVIDER_SITE_OTHER): Payer: Medicare Other | Admitting: Family Medicine

## 2019-06-18 ENCOUNTER — Encounter: Payer: Self-pay | Admitting: Family Medicine

## 2019-06-18 VITALS — BP 147/93 | HR 83 | Ht 62.0 in | Wt 115.0 lb

## 2019-06-18 DIAGNOSIS — E785 Hyperlipidemia, unspecified: Secondary | ICD-10-CM | POA: Insufficient documentation

## 2019-06-18 DIAGNOSIS — R0789 Other chest pain: Secondary | ICD-10-CM | POA: Insufficient documentation

## 2019-06-18 DIAGNOSIS — M7062 Trochanteric bursitis, left hip: Secondary | ICD-10-CM | POA: Diagnosis not present

## 2019-06-18 DIAGNOSIS — I6522 Occlusion and stenosis of left carotid artery: Secondary | ICD-10-CM

## 2019-06-18 MED ORDER — METHYLPREDNISOLONE ACETATE 40 MG/ML IJ SUSP
40.0000 mg | Freq: Once | INTRAMUSCULAR | Status: AC
  Administered 2019-06-18: 40 mg via INTRA_ARTICULAR

## 2019-06-18 NOTE — Progress Notes (Signed)
  Echocardiogram 2D Echocardiogram has been performed.  Cardell Peach 06/18/2019, 9:03 AM

## 2019-06-18 NOTE — Assessment & Plan Note (Signed)
Acute exacerbation of underlying pain.  On ultrasound.  Have more infusions of possible for further strain or tear of gluten medius. -Injection. -Counseled on home exercise therapy and supportive care. -Counseled on nutrition.

## 2019-06-18 NOTE — Progress Notes (Signed)
Holly Briggs - 85 y.o. female MRN [id number]  Date of birth: 12-24-1934  SUBJECTIVE:  Including CC & ROS.  Chief Complaint  Patient presents with  . Follow-up    follow up for left hip    Holly Briggs is a 85 y.o. female that is presenting with acute worsening of her left hip pain.  She does get improvement with the previous injections.  Denies any specific inciting event.  Pain is over the lateral aspect of her left hip.  Has some radiation distally.   Review of Systems See HPI   HISTORY: Past Medical, Surgical, Social, and Family History Reviewed & Updated per EMR.   Pertinent Historical Findings include:  Past Medical History:  Diagnosis Date  . Acute bilateral low back pain without sciatica 04/22/2019  . Arthritis   . Atypical chest pain 05/13/2019  . Cancer (HCC)    Basal cell carcinoma  . Cancer (HCC)    Squamous cell carcinoma  . COPD (chronic obstructive pulmonary disease) ([place])   . Coronary artery disease   . History of smoking 05/13/2019  . Hyperlipidemia   . Lipid disorder   . Melanoma ([place])   . Numbness and tingling of right leg    Mild residual  . Onychomadesis of toenail 04/22/2019  . Osteoporosis   . Peripheral arterial disease ([place])   . Peripheral vascular disease ([place]) 04/22/2019  . PONV (postoperative nausea and vomiting)   . Rhinitis   . Shortness of breath   . 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  . Trochanteric bursitis of left hip 03/26/2019  . Wheezing     Past Surgical History:  Procedure Laterality Date  . ANGIOPLASTY    . Arteriography    . COLONOSCOPY    . EYE SURGERY     Laser vision correction  . FASCIOTOMY  03/2017   4 compartment.  . FEMORAL-POPLITEAL BYPASS GRAFT  03/2017   with Prosthetic for ciritcal lim ischemia, 2/2 thrombosis of existing graft.  . Iliofemoral Endarterectomy Right   . SKIN BIOPSY    . SPINE SURGERY     Fusion, Lumbar  . THROMBECTOMY FEMORAL ARTERY  03/2017     Family History  Problem Relation Age of Onset  . Hyperlipidemia Mother   . Pneumonia Father   . Hyperlipidemia Sister   . Hyperlipidemia Brother   . Osteoporosis Sister     Social History   Socioeconomic History  . Marital status: Divorced    Spouse name: Not on file  . Number of children: Not on file  . Years of education: Not on file  . Highest education level: Not on file  Occupational History  . Not on file  Tobacco Use  . Smoking status: Former Smoker    Packs/day: 1.00    Years: 50.00    Pack years: 50.00    Types: Cigarettes    Quit date: 03/17/2007    Years since quitting: 12.2  . Smokeless tobacco: Never Used  Substance and Sexual Activity  . Alcohol use: Never  . Drug use: Never  . Sexual activity: Not Currently    Birth control/protection: None  Other Topics Concern  . Not on file  Social History Narrative  . Not on file   Social Determinants of Health   Financial Resource Strain:   . Difficulty of Paying Living Expenses:   Food Insecurity:   . Worried About Charity fundraiser in the Last Year:   . [employer]  of Food in the Last Year:   Transportation Needs:   . Film/video editor (Medical):   Marland Kitchen Lack of Transportation (Non-Medical):   Physical Activity:   . Days of Exercise per Week:   . Minutes of Exercise per Session:   Stress:   . Feeling of Stress :   Social Connections:   . Frequency of Communication with Friends and Family:   . Frequency of Social Gatherings with Friends and Family:   . Attends Religious Services:   . Active Member of Clubs or Organizations:   . Attends Archivist Meetings:   Marland Kitchen Marital Status:   Intimate Partner Violence:   . Fear of Current or Ex-Partner:   . Emotionally Abused:   Marland Kitchen Physically Abused:   . Sexually Abused:      PHYSICAL EXAM:  VS: BP (!) 147/93   Pulse 83   Ht 5\' 2"  (1.575 m)   Wt 115 lb (52.2 kg)   BMI 21.03 kg/m  Physical Exam Gen: NAD, alert, cooperative with exam,  well-appearing MSK:  Left hip: Obvious lateral deviation of the iliac crest and left hip itself. Weakness with hip abduction. Tenderness palpation of the greater trochanter. Neurovascular intact   Aspiration/Injection Procedure Note Holly Briggs 06/28/1934  Procedure: Injection Indications: Left hip pain  Procedure Details Consent: Risks of procedure as well as the alternatives and risks of each were explained to the (patient/caregiver).  Consent for procedure obtained. Time Out: Verified patient identification, verified procedure, site/side was marked, verified correct patient position, special equipment/implants available, medications/allergies/relevent history reviewed, required imaging and test results available.  Performed.  The area was cleaned with iodine and alcohol swabs.    The left greater trochanteric bursa was injected using 1 cc's of 40 mg Depo-Medrol and 4 cc's of 0.25% bupivacaine with a 21 2" needle.  Ultrasound was used. Images were obtained in short views showing the injection.     A sterile dressing was applied.  Patient did tolerate procedure well.     ASSESSMENT & PLAN:   Trochanteric bursitis of left hip Acute exacerbation of underlying pain.  On ultrasound.  Have more infusions of possible for further strain or tear of gluten medius. -Injection. -Counseled on home exercise therapy and supportive care. -Counseled on nutrition.

## 2019-06-18 NOTE — Patient Instructions (Signed)
Good to see you  Please try ice  Please continue with physical therapy  Please send me a message in MyChart with any questions or updates.  Please see me back in 6-8 weeks.   --Dr. Raeford Razor

## 2019-06-19 ENCOUNTER — Telehealth: Payer: Self-pay | Admitting: Emergency Medicine

## 2019-06-19 NOTE — Telephone Encounter (Signed)
Left message for patient to return call regarding results  

## 2019-06-22 ENCOUNTER — Other Ambulatory Visit: Payer: Self-pay

## 2019-06-22 ENCOUNTER — Ambulatory Visit: Payer: Medicare Other | Attending: Family Medicine

## 2019-06-22 DIAGNOSIS — M25552 Pain in left hip: Secondary | ICD-10-CM | POA: Diagnosis present

## 2019-06-22 DIAGNOSIS — R293 Abnormal posture: Secondary | ICD-10-CM | POA: Diagnosis present

## 2019-06-22 DIAGNOSIS — G8929 Other chronic pain: Secondary | ICD-10-CM

## 2019-06-22 DIAGNOSIS — R29898 Other symptoms and signs involving the musculoskeletal system: Secondary | ICD-10-CM | POA: Insufficient documentation

## 2019-06-22 DIAGNOSIS — M545 Low back pain: Secondary | ICD-10-CM | POA: Insufficient documentation

## 2019-06-22 DIAGNOSIS — M6281 Muscle weakness (generalized): Secondary | ICD-10-CM | POA: Diagnosis present

## 2019-06-22 NOTE — Therapy (Signed)
[facility] [address]  [place], [place], [address] Phone: [phone]   Fax:  [phone]  Physical Therapy Treatment  Patient Details  Name: Holly Briggs MRN: [id number] Date of Birth: 06-Mar-1935 Referring Provider (PT): Clearance Coots, MD   Encounter Date: 06/22/2019  PT End of Session - 06/22/19 0903    Visit Number  5    Number of Visits  12    Date for PT Re-Evaluation  07/15/19    Authorization Type  Medicare & AARP    PT Start Time  0848    PT Stop Time  0930    PT Time Calculation (min)  42 min    Behavior During Therapy  [facility] for tasks assessed/performed       Past Medical History:  Diagnosis Date  . Acute bilateral low back pain without sciatica 04/22/2019  . Arthritis   . Atypical chest pain 05/13/2019  . Cancer (HCC)    Basal cell carcinoma  . Cancer (HCC)    Squamous cell carcinoma  . COPD (chronic obstructive pulmonary disease) ([place])   . Coronary artery disease   . History of smoking 05/13/2019  . Hyperlipidemia   . Lipid disorder   . Melanoma ([place])   . Numbness and tingling of right leg    Mild residual  . Onychomadesis of toenail 04/22/2019  . Osteoporosis   . Peripheral arterial disease ([place])   . Peripheral vascular disease ([place]) 04/22/2019  . PONV (postoperative nausea and vomiting)   . Rhinitis   . Shortness of breath   . 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  . Trochanteric bursitis of left hip 03/26/2019  . Wheezing     Past Surgical History:  Procedure Laterality Date  . ANGIOPLASTY    . Arteriography    . COLONOSCOPY    . EYE SURGERY     Laser vision correction  . FASCIOTOMY  03/2017   4 compartment.  . FEMORAL-POPLITEAL BYPASS GRAFT  03/2017   with Prosthetic for ciritcal lim ischemia, 2/2 thrombosis of existing graft.  . Iliofemoral Endarterectomy Right   . SKIN BIOPSY    . SPINE SURGERY     Fusion, Lumbar  . THROMBECTOMY FEMORAL ARTERY   03/2017    There were no vitals filed for this visit.  Subjective Assessment - 06/22/19 0853    Subjective  Noting increased pain this morning.  Feels the L hip injection did help she received last week.    Pertinent History  extensive PAD with R LE blood clot (03/2017) requiring thrombectomy of femoral artery and femoral-popliteal BPG complicated by repeat thrombosis requiring 4 compartment fasciotomy and several month hospitalization (R LE remains edematous and sensitive to touch with residual numbness and tingling), osteoporosis, OA, remote h/o lumbar fusion, CAD, COPD, skin cancer    Diagnostic tests  L hip x-ray 03/26/19: The bones appear osteopenic. No acute fracture or dislocation. Mild degenerative changes are identified within both hip joints.    Patient Stated Goals  "to relieve the pain, I guess ... it's not going to happen"    Currently in Pain?  Yes    Pain Score  9     Pain Location  Back    Pain Orientation  Left;Lower    Pain Descriptors / Indicators  Constant;Throbbing    Pain Type  Chronic pain    Multiple Pain Sites  No  [facility] Adult PT Treatment/Exercise - 06/22/19 0001      Lumbar Exercises: Stretches   Single Knee to Chest Stretch  Left;Right;30 seconds;2 reps    Single Knee to Chest Stretch Limitations  opp LE straight    Quadruped Mid Back Stretch Limitations  5" x 10 reps seated red p-ball rollouts for lumbar stretch       Lumbar Exercises: Aerobic   Nustep  L2 x 3 min (LE only)      Lumbar Exercises: Standing   Other Standing Lumbar Exercises  standing LE clears to 6" shoe box x 10 rpes - cues required for alteranting pattern and control of movement at counter for support       Lumbar Exercises: Seated   Other Seated Lumbar Exercises  Abd bracing + yellow TB rows, horiz ABD & UE diagonals 10 x 3" each   cues to avoid yellow TB pull "behind head"     Shoulder Exercises: Standing   Row  Both;10 reps;Strengthening;Theraband     Row Limitations  therapist anchoring band               PT Short Term Goals - 06/22/19 0907      PT SHORT TERM GOAL #1   Title  Patient will be independent with initial HEP    Status  Achieved    Target Date  06/24/19        PT Long Term Goals - 06/08/19 [id number]      PT LONG TERM GOAL #1   Title  Patient will be independent with ongoing/advanced HEP    Status  On-going      PT LONG TERM GOAL #2   Title  Patient to demonstrate appropriate posture and body mechanics needed for daily activities    Status  On-going      PT LONG TERM GOAL #3   Title  Patient to report pain reduction in frequency and intensity by >/= 50%    Status  On-going      PT LONG TERM GOAL #4   Title  Patient will demonstrate improved B proximal LE strength to >/= 4/5 for improved stability and ease of mobility    Status  On-going      PT LONG TERM GOAL #5   Title  Patient to report ability to perform mobilty, ADLs and household tasks with decreased pain interference    Status  On-going            Plan - 06/22/19 0913    Clinical Impression Statement  Pt. reporting increased pain after "stretching in the middle of the night last night."  Reported good resolution of L-LBP after gentle lumbopelvic ROM and scapular strengthening therex.  Required heavy cueing with review of updated HEP for proper technique with yellow TB postural exercises.  Demo'd improved understanding after instruction.  Ended visit with pt. reporting decrease of LBP from initial 9/10>2/10 pain.  STG #1 now achieved as pt. reporting understanding of HEP.    Comorbidities  extensive PAD with R LE blood clot (03/2017) requiring thrombectomy of femoral artery and femoral-popliteal BPG complicated by repeat thrombosis requiring 4 compartment fasciotomy and several month hospitalization (R LE remains edematous and sensitive to touch with residual numbness and tingling), osteoporosis, OA, remote h/o lumbar fusion, CAD, COPD, skin  cancer    Rehab Potential  Fair    PT Treatment/Interventions  ADLs/Self Care Home Management;Cryotherapy;Electrical Stimulation;Moist Heat;Ultrasound;DME Instruction;Gait training;Functional mobility training;Therapeutic activities;Therapeutic exercise;Neuromuscular re-education;Patient/family education;Manual techniques;Passive range of motion;Dry needling;Taping  PT Next Visit Plan  Progress lumbopelvic flexibility and strengthening as tolerated, review of posture and body mechanics education as indicated, manual therapy and modalities PRN    PT Home Exercise Plan  06/03/19 - SKTC, KTOS & LTR stretches, abdominal bracing; 06/10/19 - seated march, seated adduction squeeze, seated yellow TB clam shell, sit<>stand (UE push); 06/17/19 - HEP update - abd bracing + seated yellow TB rows, horiz ABD & UE diagonals    Consulted and Agree with Plan of Care  Patient       Patient will benefit from skilled therapeutic intervention in order to improve the following deficits and impairments:  Abnormal gait, Decreased activity tolerance, Decreased balance, Decreased endurance, Decreased knowledge of precautions, Decreased mobility, Decreased range of motion, Decreased safety awareness, Decreased strength, Difficulty walking, Increased edema, Increased fascial restricitons, Increased muscle spasms, Impaired perceived functional ability, Impaired flexibility, Improper body mechanics, Postural dysfunction, Pain  Visit Diagnosis: Chronic bilateral low back pain without sciatica  Pain in left hip  Abnormal posture  Muscle weakness (generalized)  Other symptoms and signs involving the musculoskeletal system     Problem List Patient Active Problem List   Diagnosis Date Noted  . Dyslipidemia 06/11/2019  . Atypical chest pain 05/13/2019  . History of smoking 05/13/2019  . Peripheral vascular disease ([place]) 04/22/2019  . 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  . Acute bilateral low back  pain without sciatica 04/22/2019  . Onychomadesis of toenail 04/22/2019  . Trochanteric bursitis of left hip 03/26/2019    Bess Harvest, PTA 06/22/19 12:09 PM   [facility] [address]  [place], [place], [address] Phone: [phone]   Fax:  [phone]  Name: Holly Briggs MRN: [id number] Date of Birth: 09-09-1934

## 2019-06-25 ENCOUNTER — Other Ambulatory Visit: Payer: Self-pay

## 2019-06-25 ENCOUNTER — Ambulatory Visit: Payer: Medicare Other

## 2019-06-25 DIAGNOSIS — M545 Low back pain, unspecified: Secondary | ICD-10-CM

## 2019-06-25 DIAGNOSIS — G8929 Other chronic pain: Secondary | ICD-10-CM

## 2019-06-25 DIAGNOSIS — R293 Abnormal posture: Secondary | ICD-10-CM

## 2019-06-25 DIAGNOSIS — M6281 Muscle weakness (generalized): Secondary | ICD-10-CM

## 2019-06-25 DIAGNOSIS — R29898 Other symptoms and signs involving the musculoskeletal system: Secondary | ICD-10-CM

## 2019-06-25 DIAGNOSIS — M25552 Pain in left hip: Secondary | ICD-10-CM

## 2019-06-25 NOTE — Therapy (Signed)
[facility] [address]  [place], [place], [address] Phone: [phone]   Fax:  [phone]  Physical Therapy Treatment  Patient Details  Name: Holly Briggs MRN: [id number] Date of Birth: 06/14/1934 Referring Provider (PT): Clearance Coots, MD   Encounter Date: 06/25/2019  PT End of Session - 06/25/19 0910    Visit Number  6    Number of Visits  12    Date for PT Re-Evaluation  07/15/19    Authorization Type  Medicare & AARP    PT Start Time  [phone]    PT Stop Time  0930    PT Time Calculation (min)  38 min    Activity Tolerance  Patient tolerated treatment well;Patient limited by pain    Behavior During Therapy  [facility] for tasks assessed/performed       Past Medical History:  Diagnosis Date  . Acute bilateral low back pain without sciatica 04/22/2019  . Arthritis   . Atypical chest pain 05/13/2019  . Cancer (HCC)    Basal cell carcinoma  . Cancer (HCC)    Squamous cell carcinoma  . COPD (chronic obstructive pulmonary disease) ([place])   . Coronary artery disease   . History of smoking 05/13/2019  . Hyperlipidemia   . Lipid disorder   . Melanoma ([place])   . Numbness and tingling of right leg    Mild residual  . Onychomadesis of toenail 04/22/2019  . Osteoporosis   . Peripheral arterial disease ([place])   . Peripheral vascular disease ([place]) 04/22/2019  . PONV (postoperative nausea and vomiting)   . Rhinitis   . Shortness of breath   . 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  . Trochanteric bursitis of left hip 03/26/2019  . Wheezing     Past Surgical History:  Procedure Laterality Date  . ANGIOPLASTY    . Arteriography    . COLONOSCOPY    . EYE SURGERY     Laser vision correction  . FASCIOTOMY  03/2017   4 compartment.  . FEMORAL-POPLITEAL BYPASS GRAFT  03/2017   with Prosthetic for ciritcal lim ischemia, 2/2 thrombosis of existing graft.  . Iliofemoral Endarterectomy Right   .  SKIN BIOPSY    . SPINE SURGERY     Fusion, Lumbar  . THROMBECTOMY FEMORAL ARTERY  03/2017    There were no vitals filed for this visit.  Subjective Assessment - 06/25/19 0902    Subjective  Feels L lateral buttocks pain today which she feels is related to recent injection.    Pertinent History  extensive PAD with R LE blood clot (03/2017) requiring thrombectomy of femoral artery and femoral-popliteal BPG complicated by repeat thrombosis requiring 4 compartment fasciotomy and several month hospitalization (R LE remains edematous and sensitive to touch with residual numbness and tingling), osteoporosis, OA, remote h/o lumbar fusion, CAD, COPD, skin cancer    Diagnostic tests  L hip x-ray 03/26/19: The bones appear osteopenic. No acute fracture or dislocation. Mild degenerative changes are identified within both hip joints.    Patient Stated Goals  "to relieve the pain, I guess ... it's not going to happen"    Currently in Pain?  No/denies    Pain Score  0-No pain    Pain Location  Back    Pain Orientation  Left;Lower    Pain Descriptors / Indicators  Constant;Throbbing    Pain Type  Chronic pain    Pain Radiating Towards  into  L buttocks    Pain Frequency  Constant    Multiple Pain Sites  No                       [facility] Adult PT Treatment/Exercise - 06/25/19 0001      Self-Care   Self-Care  Other Self-Care Comments;Posture    Posture  Discussed sitting posture with reading (1.5-2 hours per day)       Lumbar Exercises: Stretches   Single Knee to Chest Stretch  Left;Right;30 seconds;2 reps    Single Knee to Chest Stretch Limitations  opp LE straight    Piriformis Stretch  Right;Left;1 rep;30 seconds    Piriformis Stretch Limitations  seated KTOS    Other Lumbar Stretch Exercise  B seated glute med/ITB stretch as able x 30 sec       Lumbar Exercises: Aerobic   Recumbent Bike  Lvl 1, 3 min    pillow behind back   Nustep  L3 x 3 min (LE only)      Lumbar Exercises:  Seated   Sit to Stand  5 reps      Knee/Hip Exercises: Standing   Hip Abduction  Right;Left;10 reps;Knee straight;Stengthening    Abduction Limitations  chair     Hip Extension  Right;Left;1 set;5 reps;Stengthening;Knee straight      Knee/Hip Exercises: Seated   Clamshell with TheraBand  Red   x 10 alternating LE            PT Education - 06/25/19 1240    Education Details  HEP update;  standing hip abduction, extension    Person(s) Educated  Patient    Methods  Explanation;Demonstration;Verbal cues;Handout    Comprehension  Verbalized understanding;Returned demonstration;Verbal cues required       PT Short Term Goals - 06/22/19 0907      PT SHORT TERM GOAL #1   Title  Patient will be independent with initial HEP    Status  Achieved    Target Date  06/24/19        PT Long Term Goals - 06/25/19 0911      PT LONG TERM GOAL #1   Title  Patient will be independent with ongoing/advanced HEP    Status  On-going      PT LONG TERM GOAL #2   Title  Patient to demonstrate appropriate posture and body mechanics needed for daily activities    Status  On-going      PT LONG TERM GOAL #3   Title  Patient to report pain reduction in frequency and intensity by >/= 50%    Status  Partially Met   06/25/19: 35-40% improvment in overall pain     PT LONG TERM GOAL #4   Title  Patient will demonstrate improved B proximal LE strength to >/= 4/5 for improved stability and ease of mobility    Status  On-going      PT LONG TERM GOAL #5   Title  Patient to report ability to perform mobilty, ADLs and household tasks with decreased pain interference    Status  On-going            Plan - 06/25/19 0912    Clinical Impression Statement  Holly Briggs reporting partial HEP compliance.  Notes not having significant LBP however with complaint of lateral hip pain today.  LE stretching performed to pt. tolerance today addressing TFL, glute med along with gentle standing strengthening  activities which were tolerated well without increased  pain.  Updated HEP with standing hip abd, ext. kicker with special focus on abdom. activation as to maintain neutral lumbar spine.  Will plan to address pt. tolerance for updated HEP in upcoming visit.    Comorbidities  extensive PAD with R LE blood clot (03/2017) requiring thrombectomy of femoral artery and femoral-popliteal BPG complicated by repeat thrombosis requiring 4 compartment fasciotomy and several month hospitalization (R LE remains edematous and sensitive to touch with residual numbness and tingling), osteoporosis, OA, remote h/o lumbar fusion, CAD, COPD, skin cancer    Rehab Potential  Fair    PT Treatment/Interventions  ADLs/Self Care Home Management;Cryotherapy;Electrical Stimulation;Moist Heat;Ultrasound;DME Instruction;Gait training;Functional mobility training;Therapeutic activities;Therapeutic exercise;Neuromuscular re-education;Patient/family education;Manual techniques;Passive range of motion;Dry needling;Taping    PT Next Visit Plan  Progress lumbopelvic flexibility and strengthening as tolerated, review of posture and body mechanics education as indicated, manual therapy and modalities PRN    PT Home Exercise Plan  06/03/19 - SKTC, KTOS & LTR stretches, abdominal bracing; 06/10/19 - seated march, seated adduction squeeze, seated yellow TB clam shell, sit<>stand (UE push); 06/17/19 - HEP update - abd bracing + seated yellow TB rows, horiz ABD & UE diagonals    Consulted and Agree with Plan of Care  Patient       Patient will benefit from skilled therapeutic intervention in order to improve the following deficits and impairments:  Abnormal gait, Decreased activity tolerance, Decreased balance, Decreased endurance, Decreased knowledge of precautions, Decreased mobility, Decreased range of motion, Decreased safety awareness, Decreased strength, Difficulty walking, Increased edema, Increased fascial restricitons, Increased muscle spasms,  Impaired perceived functional ability, Impaired flexibility, Improper body mechanics, Postural dysfunction, Pain  Visit Diagnosis: Chronic bilateral low back pain without sciatica  Pain in left hip  Abnormal posture  Muscle weakness (generalized)  Other symptoms and signs involving the musculoskeletal system     Problem List Patient Active Problem List   Diagnosis Date Noted  . Dyslipidemia 06/11/2019  . Atypical chest pain 05/13/2019  . History of smoking 05/13/2019  . Peripheral vascular disease ([place]) 04/22/2019  . 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  . Acute bilateral low back pain without sciatica 04/22/2019  . Onychomadesis of toenail 04/22/2019  . Trochanteric bursitis of left hip 03/26/2019    Bess Harvest, PTA 06/25/19 12:46 PM   [facility] [address]  [place], [place], [address] Phone: [phone]   Fax:  [phone]  Name: Holly Briggs MRN: [id number] Date of Birth: [birth year]

## 2019-06-29 ENCOUNTER — Ambulatory Visit: Payer: Medicare Other

## 2019-06-29 ENCOUNTER — Other Ambulatory Visit: Payer: Self-pay

## 2019-06-29 DIAGNOSIS — M25552 Pain in left hip: Secondary | ICD-10-CM

## 2019-06-29 DIAGNOSIS — R29898 Other symptoms and signs involving the musculoskeletal system: Secondary | ICD-10-CM

## 2019-06-29 DIAGNOSIS — R293 Abnormal posture: Secondary | ICD-10-CM

## 2019-06-29 DIAGNOSIS — M6281 Muscle weakness (generalized): Secondary | ICD-10-CM

## 2019-06-29 DIAGNOSIS — G8929 Other chronic pain: Secondary | ICD-10-CM

## 2019-06-29 DIAGNOSIS — M545 Low back pain: Secondary | ICD-10-CM | POA: Diagnosis not present

## 2019-06-29 NOTE — Therapy (Signed)
[facility] [address]  [place], [place], [address] Phone: [phone]   Fax:  [phone]  Physical Therapy Treatment  Patient Details  Name: Holly Briggs MRN: [id number] Date of Birth: [birth year] Referring Provider (PT): Clearance Coots, MD   Encounter Date: 06/29/2019  PT End of Session - 06/29/19 0855    Visit Number  7    Number of Visits  12    Date for PT Re-Evaluation  07/15/19    Authorization Type  Medicare & AARP    PT Start Time  [phone]    PT Stop Time  0930    PT Time Calculation (min)  44 min    Activity Tolerance  Patient tolerated treatment well    Behavior During Therapy  [facility] for tasks assessed/performed       Past Medical History:  Diagnosis Date  . Acute bilateral low back pain without sciatica 04/22/2019  . Arthritis   . Atypical chest pain 05/13/2019  . Cancer (HCC)    Basal cell carcinoma  . Cancer (HCC)    Squamous cell carcinoma  . COPD (chronic obstructive pulmonary disease) ([place])   . Coronary artery disease   . History of smoking 05/13/2019  . Hyperlipidemia   . Lipid disorder   . Melanoma ([place])   . Numbness and tingling of right leg    Mild residual  . Onychomadesis of toenail 04/22/2019  . Osteoporosis   . Peripheral arterial disease ([place])   . Peripheral vascular disease ([place]) 04/22/2019  . PONV (postoperative nausea and vomiting)   . Rhinitis   . Shortness of breath   . 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  . Trochanteric bursitis of left hip 03/26/2019  . Wheezing     Past Surgical History:  Procedure Laterality Date  . ANGIOPLASTY    . Arteriography    . COLONOSCOPY    . EYE SURGERY     Laser vision correction  . FASCIOTOMY  03/2017   4 compartment.  . FEMORAL-POPLITEAL BYPASS GRAFT  03/2017   with Prosthetic for ciritcal lim ischemia, 2/2 thrombosis of existing graft.  . Iliofemoral Endarterectomy Right   . SKIN BIOPSY    . SPINE  SURGERY     Fusion, Lumbar  . THROMBECTOMY FEMORAL ARTERY  03/2017    There were no vitals filed for this visit.  Subjective Assessment - 06/29/19 0851    Subjective  Pt. reporting nearly no LBP over weekend primarily L hip pain.  Still having some LBP with vacuuming.    Pertinent History  extensive PAD with R LE blood clot (03/2017) requiring thrombectomy of femoral artery and femoral-popliteal BPG complicated by repeat thrombosis requiring 4 compartment fasciotomy and several month hospitalization (R LE remains edematous and sensitive to touch with residual numbness and tingling), osteoporosis, OA, remote h/o lumbar fusion, CAD, COPD, skin cancer    Diagnostic tests  L hip x-ray 03/26/19: The bones appear osteopenic. No acute fracture or dislocation. Mild degenerative changes are identified within both hip joints.    Patient Stated Goals  "to relieve the pain, I guess ... it's not going to happen"    Currently in Pain?  No/denies    Pain Score  0-No pain   0/10 over weekend   Pain Location  Back    Multiple Pain Sites  No    Pain Score  0   L hip pain rising to 3/10 over weekend at times  without known trigger   Pain Location  Hip    Pain Orientation  Left    Pain Descriptors / Indicators  Constant;Throbbing    Pain Type  Chronic pain                       [facility] Adult PT Treatment/Exercise - 06/29/19 0001      Self-Care   Self-Care  Other Self-Care Comments    Other Self-Care Comments   Re-visited proper body mechanics with vacuuming at home as pt. noting LBP with this task; instructe pt. in upright posture keeping vacuum close to BOS to reduce lumbar strain with pt. verbalizing understanding;  also discussed strategies to reduce lumbar strain with this task with pacing performing activities with frequent breaks to reduce lumbar strain      Lumbar Exercises: Stretches   Single Knee to Chest Stretch  Left;Right;30 seconds;1 rep   pt. likes this stretch    Single Knee to  Chest Stretch Limitations  opp LE straight    Lower Trunk Rotation Limitations  5" x 10 reps    pt. likes this stretch    Hip Flexor Stretch  Right;1 rep;30 seconds    Hip Flexor Stretch Limitations  Manual with therapsit       Lumbar Exercises: Aerobic   Recumbent Bike  Lvl 1, 3 min    pillow behind    Nustep  L3 x 3 min (LE only)      Lumbar Exercises: Supine   Clam  10 reps   cues for 3" hold    Clam Limitations  red TB in hooklying B hip abd/ER into red TB at knees     Bent Knee Raise  10 reps;3 seconds   cues for abdom. bracing    Bent Knee Raise Limitations  red TB march       Knee/Hip Exercises: Standing   Heel Raises  Both;10 reps    Heel Raises Limitations  toe raise at counter     Knee Flexion  Right;Left;10 reps    Knee Flexion Limitations  at counter     Hip Flexion  --    Hip Flexion Limitations  --    Hip Abduction  Right;Left;10 reps;Knee straight;Stengthening    Abduction Limitations  chair     Hip Extension  Right;Left;1 set;Stengthening;Knee straight;10 reps    Extension Limitations  counter                PT Short Term Goals - 06/22/19 0907      PT SHORT TERM GOAL #1   Title  Patient will be independent with initial HEP    Status  Achieved    Target Date  06/24/19        PT Long Term Goals - 06/29/19 0905      PT LONG TERM GOAL #1   Title  Patient will be independent with ongoing/advanced HEP    Status  On-going      PT LONG TERM GOAL #2   Title  Patient to demonstrate appropriate posture and body mechanics needed for daily activities    Status  On-going      PT LONG TERM GOAL #3   Title  Patient to report pain reduction in frequency and intensity by >/= 50%    Status  Partially Met   06/25/19: 35-40% improvment in overall pain     PT LONG TERM GOAL #4   Title  Patient will demonstrate improved B  proximal LE strength to >/= 4/5 for improved stability and ease of mobility    Status  On-going      PT LONG TERM GOAL #5   Title   Patient to report ability to perform mobilty, ADLs and household tasks with decreased pain interference    Status  Partially Met   06/29/19: notes greater ease with bending tasks at home with ADLS.  Still much pain and difficulty with vacuuming           Plan - 06/29/19 0908    Clinical Impression Statement  Pt. reporting she has had reduce LBP over weekend ("nearly pain free in lower back") with L hip pain being her primary concern.  Pt. able to partially achieve LTG #5 as she notes improved ease with bending tasks with ADLs at home.  Still noting much difficulty/LBP with vacuuming thus reviewed proper body mechanics with this task for hopeful improved tolerance.  Progressed standing lumbopelvic strengthening activities today which were well tolerated.  Did have increased pain after performing red TB hooklying brace/march which subsided with LE stretching.  Will consider further HEP update in coming sessions.    Comorbidities  extensive PAD with R LE blood clot (03/2017) requiring thrombectomy of femoral artery and femoral-popliteal BPG complicated by repeat thrombosis requiring 4 compartment fasciotomy and several month hospitalization (R LE remains edematous and sensitive to touch with residual numbness and tingling), osteoporosis, OA, remote h/o lumbar fusion, CAD, COPD, skin cancer    Rehab Potential  Fair    PT Treatment/Interventions  ADLs/Self Care Home Management;Cryotherapy;Electrical Stimulation;Moist Heat;Ultrasound;DME Instruction;Gait training;Functional mobility training;Therapeutic activities;Therapeutic exercise;Neuromuscular re-education;Patient/family education;Manual techniques;Passive range of motion;Dry needling;Taping    PT Next Visit Plan  Progress to more standing lumbopelvic strengthening per pt. tolerance; HEP updated prn; instruction in proper body mechanics with vacuuming prn    PT Home Exercise Plan  06/03/19 - SKTC, KTOS & LTR stretches, abdominal bracing; 06/10/19 -  seated march, seated adduction squeeze, seated yellow TB clam shell, sit<>stand (UE push); 06/17/19 - HEP update - abd bracing + seated yellow TB rows, horiz ABD & UE diagonals; 06/25/19 - standing hip ext., abd kicker at counter    Consulted and Agree with Plan of Care  Patient       Patient will benefit from skilled therapeutic intervention in order to improve the following deficits and impairments:  Abnormal gait, Decreased activity tolerance, Decreased balance, Decreased endurance, Decreased knowledge of precautions, Decreased mobility, Decreased range of motion, Decreased safety awareness, Decreased strength, Difficulty walking, Increased edema, Increased fascial restricitons, Increased muscle spasms, Impaired perceived functional ability, Impaired flexibility, Improper body mechanics, Postural dysfunction, Pain  Visit Diagnosis: Chronic bilateral low back pain without sciatica  Pain in left hip  Abnormal posture  Muscle weakness (generalized)  Other symptoms and signs involving the musculoskeletal system     Problem List Patient Active Problem List   Diagnosis Date Noted  . Dyslipidemia 06/11/2019  . Atypical chest pain 05/13/2019  . History of smoking 05/13/2019  . Peripheral vascular disease ([place]) 04/22/2019  . 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  . Acute bilateral low back pain without sciatica 04/22/2019  . Onychomadesis of toenail 04/22/2019  . Trochanteric bursitis of left hip 03/26/2019    Bess Harvest, PTA 06/29/19 12:29 PM   [facility] [address]  [place], [place], [address] Phone: [phone]   Fax:  [phone]  Name: Satine Hausner MRN: [id number] Date  of Birth: [birth year]

## 2019-07-02 ENCOUNTER — Ambulatory Visit: Payer: Medicare Other | Admitting: Physical Therapy

## 2019-07-02 ENCOUNTER — Encounter: Payer: Self-pay | Admitting: Physical Therapy

## 2019-07-02 ENCOUNTER — Other Ambulatory Visit: Payer: Self-pay

## 2019-07-02 DIAGNOSIS — R29898 Other symptoms and signs involving the musculoskeletal system: Secondary | ICD-10-CM

## 2019-07-02 DIAGNOSIS — M545 Low back pain: Secondary | ICD-10-CM | POA: Diagnosis not present

## 2019-07-02 DIAGNOSIS — M6281 Muscle weakness (generalized): Secondary | ICD-10-CM

## 2019-07-02 DIAGNOSIS — M25552 Pain in left hip: Secondary | ICD-10-CM

## 2019-07-02 DIAGNOSIS — G8929 Other chronic pain: Secondary | ICD-10-CM

## 2019-07-02 DIAGNOSIS — R293 Abnormal posture: Secondary | ICD-10-CM

## 2019-07-02 NOTE — Therapy (Addendum)
[facility] [address]  [place], [place], [address] Phone: [phone]   Fax:  [phone]  Physical Therapy Treatment  Patient Details  Name: Holly Briggs MRN: [id number] Date of Birth: 07-Oct-1934 Referring Provider (PT): Clearance Coots, MD   Encounter Date: 07/02/2019  PT End of Session - 07/02/19 0847    Visit Number  8    Number of Visits  12    Date for PT Re-Evaluation  07/15/19    Authorization Type  Medicare & AARP    PT Start Time  [phone]    PT Stop Time  0940    PT Time Calculation (min)  53 min    Activity Tolerance  Patient tolerated treatment well    Behavior During Therapy  [facility] for tasks assessed/performed       Past Medical History:  Diagnosis Date  . Acute bilateral low back pain without sciatica 04/22/2019  . Arthritis   . Atypical chest pain 05/13/2019  . Cancer (HCC)    Basal cell carcinoma  . Cancer (HCC)    Squamous cell carcinoma  . COPD (chronic obstructive pulmonary disease) ([place])   . Coronary artery disease   . History of smoking 05/13/2019  . Hyperlipidemia   . Lipid disorder   . Melanoma ([place])   . Numbness and tingling of right leg    Mild residual  . Onychomadesis of toenail 04/22/2019  . Osteoporosis   . Peripheral arterial disease ([place])   . Peripheral vascular disease ([place]) 04/22/2019  . PONV (postoperative nausea and vomiting)   . Rhinitis   . Shortness of breath   . 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  . Trochanteric bursitis of left hip 03/26/2019  . Wheezing     Past Surgical History:  Procedure Laterality Date  . ANGIOPLASTY    . Arteriography    . COLONOSCOPY    . EYE SURGERY     Laser vision correction  . FASCIOTOMY  03/2017   4 compartment.  . FEMORAL-POPLITEAL BYPASS GRAFT  03/2017   with Prosthetic for ciritcal lim ischemia, 2/2 thrombosis of existing graft.  . Iliofemoral Endarterectomy Right   . SKIN BIOPSY    . SPINE  SURGERY     Fusion, Lumbar  . THROMBECTOMY FEMORAL ARTERY  03/2017    There were no vitals filed for this visit.  Subjective Assessment - 07/02/19 0851    Subjective  Pt reporting her usual R sided pain has now moved to the L side.    Pertinent History  extensive PAD with R LE blood clot (03/2017) requiring thrombectomy of femoral artery and femoral-popliteal BPG complicated by repeat thrombosis requiring 4 compartment fasciotomy and several month hospitalization (R LE remains edematous and sensitive to touch with residual numbness and tingling), osteoporosis, OA, remote h/o lumbar fusion, CAD, COPD, skin cancer    Diagnostic tests  L hip x-ray 03/26/19: The bones appear osteopenic. No acute fracture or dislocation. Mild degenerative changes are identified within both hip joints.    Patient Stated Goals  "to relieve the pain, I guess ... it's not going to happen"    Currently in Pain?  Yes    Pain Score  0-No pain    Pain Location  Back    Pain Score  8    Pain Location  Hip    Pain Orientation  Left    Pain Descriptors / Indicators  Aching;Spasm    Pain Type  Chronic pain    Pain Frequency  Constant                       [facility] Adult PT Treatment/Exercise - 07/02/19 0847      Lumbar Exercises: Stretches   ITB Stretch  Left;30 seconds;2 reps    ITB Stretch Limitations  supine with strap    Piriformis Stretch  Left;30 seconds;2 reps    Piriformis Stretch Limitations  supine KTOS      Lumbar Exercises: Aerobic   Nustep  L3 x 3 min (LE only)      Lumbar Exercises: Seated   Hip Flexion on Ball  Right;Left;5 reps    Hip Flexion on Ball Limitations  cues for abd bracing - discontinued d/t recurrence of L hip pain    Other Seated Lumbar Exercises  Abd bracing + yellow TB rows, horiz ABD & UE diagonals 10 x 3" each - seated on dynadisc      Lumbar Exercises: Supine   Clam  10 reps;3 seconds    Clam Limitations  red TB hooklying alt hip abd/ER    Bent Knee Raise  5 reps;3  seconds    Bent Knee Raise Limitations  red TB brace marching - discontinued due to recurrence of L hip pain      Moist Heat Therapy   Number Minutes Moist Heat  10 Minutes    Moist Heat Location  Hip   L      Manual Therapy   Manual Therapy  Soft tissue mobilization;Myofascial release    Manual therapy comments  supine    Soft tissue mobilization  STM to L lateral hip (TLF, ITB, proximal quads) - pt noting relief of pain    Myofascial Release  manual TPR to STM to L  TLF, ITB & proximal quads - pt noting relief of pain               PT Short Term Goals - 07/02/19 0913      PT SHORT TERM GOAL #1   Title  Patient will be independent with initial HEP    Status  Achieved   06/22/19       PT Long Term Goals - 06/29/19 0905      PT LONG TERM GOAL #1   Title  Patient will be independent with ongoing/advanced HEP    Status  On-going      PT LONG TERM GOAL #2   Title  Patient to demonstrate appropriate posture and body mechanics needed for daily activities    Status  On-going      PT LONG TERM GOAL #3   Title  Patient to report pain reduction in frequency and intensity by >/= 50%    Status  Partially Met   06/25/19: 35-40% improvment in overall pain     PT LONG TERM GOAL #4   Title  Patient will demonstrate improved B proximal LE strength to >/= 4/5 for improved stability and ease of mobility    Status  On-going      PT LONG TERM GOAL #5   Title  Patient to report ability to perform mobilty, ADLs and household tasks with decreased pain interference    Status  Partially Met   06/29/19: notes greater ease with bending tasks at home with ADLS.  Still much pain and difficulty with vacuuming           Plan - 07/02/19 0940    Clinical Impression Statement  Devina arriving to PT with c/o increased L hip pain, stating she feels like pain has move from R hip to L. Very ttp over L lateral hip and proximal thigh with TPs identified in TFL, proximal lateral quads and ITB -  improved following manual therapy but prone to recurrence with attempts at exercises incorporating hip flexion. Good tolerance for progression of resisted upper body postural exercise to unstable surface (seated on dynadisc). Session concluded with moist heat to L lateral hip to promote further muscle relaxation and pain relief.    Personal Factors and Comorbidities  Comorbidity 3+;Time since onset of injury/illness/exacerbation;Fitness;Age;Past/Current Experience    Comorbidities  extensive PAD with R LE blood clot (03/2017) requiring thrombectomy of femoral artery and femoral-popliteal BPG complicated by repeat thrombosis requiring 4 compartment fasciotomy and several month hospitalization (R LE remains edematous and sensitive to touch with residual numbness and tingling), osteoporosis, OA, remote h/o lumbar fusion, CAD, COPD, skin cancer    Examination-Activity Limitations  Bed Mobility;Bend;Carry;Lift;Locomotion Level;Sit;Sleep;Squat;Stairs;Stand;Transfers    Examination-Participation Restrictions  Cleaning;Community Activity;Laundry;Meal Prep;Shop    Rehab Potential  Fair    PT Frequency  2x / week    PT Duration  6 weeks    PT Treatment/Interventions  ADLs/Self Care Home Management;Cryotherapy;Electrical Stimulation;Moist Heat;Ultrasound;DME Instruction;Gait training;Functional mobility training;Therapeutic activities;Therapeutic exercise;Neuromuscular re-education;Patient/family education;Manual techniques;Passive range of motion;Dry needling;Taping    PT Next Visit Plan  Progress lumbopelvic flexibility and strengthening moving toward standing activities as tolerated, review of posture and body mechanics education as indicated (particularly with vacuuming), HEP update PRN, manual therapy and modalities PRN    PT Home Exercise Plan  06/03/19 - SKTC, KTOS & LTR stretches, abdominal bracing; 06/10/19 - seated march, seated adduction squeeze, seated yellow TB clam shell, sit<>stand (UE push); 06/17/19 -  HEP update - abd bracing + seated yellow TB rows, horiz ABD & UE diagonals; 06/25/19 - standing hip ext & abd    Consulted and Agree with Plan of Care  Patient       Patient will benefit from skilled therapeutic intervention in order to improve the following deficits and impairments:  Abnormal gait, Decreased activity tolerance, Decreased balance, Decreased endurance, Decreased knowledge of precautions, Decreased mobility, Decreased range of motion, Decreased safety awareness, Decreased strength, Difficulty walking, Increased edema, Increased fascial restricitons, Increased muscle spasms, Impaired perceived functional ability, Impaired flexibility, Improper body mechanics, Postural dysfunction, Pain  Visit Diagnosis: Chronic bilateral low back pain without sciatica  Pain in left hip  Abnormal posture  Muscle weakness (generalized)  Other symptoms and signs involving the musculoskeletal system     Problem List Patient Active Problem List   Diagnosis Date Noted  . Dyslipidemia 06/11/2019  . Atypical chest pain 05/13/2019  . History of smoking 05/13/2019  . Peripheral vascular disease ([place]) 04/22/2019  . 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  . Acute bilateral low back pain without sciatica 04/22/2019  . Onychomadesis of toenail 04/22/2019  . Trochanteric bursitis of left hip 03/26/2019    Percival Spanish, PT, MPT 07/02/2019, 6:29 PM  [facility] [address]  [place], [place], [address] Phone: [phone]   Fax:  [phone]  Name: Holly Briggs MRN: [id number] Date of Birth: 1934/05/01

## 2019-07-06 ENCOUNTER — Ambulatory Visit: Payer: Medicare Other | Admitting: Physical Therapy

## 2019-07-09 ENCOUNTER — Other Ambulatory Visit: Payer: Self-pay

## 2019-07-09 ENCOUNTER — Ambulatory Visit: Payer: Medicare Other

## 2019-07-09 DIAGNOSIS — R29898 Other symptoms and signs involving the musculoskeletal system: Secondary | ICD-10-CM

## 2019-07-09 DIAGNOSIS — R293 Abnormal posture: Secondary | ICD-10-CM

## 2019-07-09 DIAGNOSIS — G8929 Other chronic pain: Secondary | ICD-10-CM

## 2019-07-09 DIAGNOSIS — M25552 Pain in left hip: Secondary | ICD-10-CM

## 2019-07-09 DIAGNOSIS — M6281 Muscle weakness (generalized): Secondary | ICD-10-CM

## 2019-07-09 DIAGNOSIS — M545 Low back pain: Secondary | ICD-10-CM | POA: Diagnosis not present

## 2019-07-09 NOTE — Therapy (Signed)
[facility] [address]  [place], [place], [address] Phone: [phone]   Fax:  [phone]  Physical Therapy Treatment  Patient Details  Name: Holly Briggs MRN: [id number] Date of Birth: Jun 20, 1934 Referring Provider (PT): Clearance Coots, MD   Encounter Date: 07/09/2019  PT End of Session - 07/09/19 0900    Visit Number  9    Number of Visits  12    Date for PT Re-Evaluation  07/15/19    Authorization Type  Medicare & AARP    PT Start Time  [phone]    PT Stop Time  0930    PT Time Calculation (min)  40 min    Activity Tolerance  Patient tolerated treatment well    Behavior During Therapy  [facility] for tasks assessed/performed       Past Medical History:  Diagnosis Date  . Acute bilateral low back pain without sciatica 04/22/2019  . Arthritis   . Atypical chest pain 05/13/2019  . Cancer (HCC)    Basal cell carcinoma  . Cancer (HCC)    Squamous cell carcinoma  . COPD (chronic obstructive pulmonary disease) ([place])   . Coronary artery disease   . History of smoking 05/13/2019  . Hyperlipidemia   . Lipid disorder   . Melanoma ([place])   . Numbness and tingling of right leg    Mild residual  . Onychomadesis of toenail 04/22/2019  . Osteoporosis   . Peripheral arterial disease ([place])   . Peripheral vascular disease ([place]) 04/22/2019  . PONV (postoperative nausea and vomiting)   . Rhinitis   . Shortness of breath   . 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  . Trochanteric bursitis of left hip 03/26/2019  . Wheezing     Past Surgical History:  Procedure Laterality Date  . ANGIOPLASTY    . Arteriography    . COLONOSCOPY    . EYE SURGERY     Laser vision correction  . FASCIOTOMY  03/2017   4 compartment.  . FEMORAL-POPLITEAL BYPASS GRAFT  03/2017   with Prosthetic for ciritcal lim ischemia, 2/2 thrombosis of existing graft.  . Iliofemoral Endarterectomy Right   . SKIN BIOPSY    . SPINE  SURGERY     Fusion, Lumbar  . THROMBECTOMY FEMORAL ARTERY  03/2017    There were no vitals filed for this visit.  Subjective Assessment - 07/09/19 0853    Subjective  Pt reports her L hip is bothering her, per usual, but feels better today.    Pertinent History  extensive PAD with R LE blood clot (03/2017) requiring thrombectomy of femoral artery and femoral-popliteal BPG complicated by repeat thrombosis requiring 4 compartment fasciotomy and several month hospitalization (R LE remains edematous and sensitive to touch with residual numbness and tingling), osteoporosis, OA, remote h/o lumbar fusion, CAD, COPD, skin cancer    Diagnostic tests  L hip x-ray 03/26/19: The bones appear osteopenic. No acute fracture or dislocation. Mild degenerative changes are identified within both hip joints.    Patient Stated Goals  "to relieve the pain, I guess ... it's not going to happen"    Currently in Pain?  Yes    Pain Score  3     Pain Location  Hip    Pain Orientation  Left    Pain Score  3    Pain Location  Back    Pain Orientation  Lower  [facility] Adult PT Treatment/Exercise - 07/09/19 0001      Lumbar Exercises: Standing   Other Standing Lumbar Exercises  PPT at wall with pillow behind back 10 x 3"   reported back stretch and pain relief     Lumbar Exercises: Supine   Bridge  15 reps;3 seconds    Bridge Limitations  low bridge with PPT   pt reported some stretching in low back and pain relief     Manual Therapy   Manual Therapy  Soft tissue mobilization    Manual therapy comments  R S/L    Soft tissue mobilization  STM/XFM to TFL, gluteals               PT Short Term Goals - 07/02/19 0913      PT SHORT TERM GOAL #1   Title  Patient will be independent with initial HEP    Status  Achieved   06/22/19       PT Long Term Goals - 06/29/19 0905      PT LONG TERM GOAL #1   Title  Patient will be independent with ongoing/advanced HEP    Status   On-going      PT LONG TERM GOAL #2   Title  Patient to demonstrate appropriate posture and body mechanics needed for daily activities    Status  On-going      PT LONG TERM GOAL #3   Title  Patient to report pain reduction in frequency and intensity by >/= 50%    Status  Partially Met   06/25/19: 35-40% improvment in overall pain     PT LONG TERM GOAL #4   Title  Patient will demonstrate improved B proximal LE strength to >/= 4/5 for improved stability and ease of mobility    Status  On-going      PT LONG TERM GOAL #5   Title  Patient to report ability to perform mobilty, ADLs and household tasks with decreased pain interference    Status  Partially Met   06/29/19: notes greater ease with bending tasks at home with ADLS.  Still much pain and difficulty with vacuuming           Plan - 07/09/19 0914    Clinical Impression Statement  Pt presents with some improvement in L hip/low back pain. She tolerated R sidelying fairly well wiht some initial pain, TTP to L TFL and glutes. Pt very weak in L hip abductors/external rotators, so added some MET to clams with light resistance. Added low bridging with focus on PPT with pt reporting good low back stretching and no pain afterward. Pt able to sit up quickly without pain and perform standing PPT at wall with pillow behind back for comfort. Pt ambulated out of session with more fluidity of gait - edu to perform standing and supine PPT as needed for pain relief throughout the day, as well as continue strengthening hip via HEP.    Personal Factors and Comorbidities  Comorbidity 3+;Time since onset of injury/illness/exacerbation;Fitness;Age;Past/Current Experience    Comorbidities  extensive PAD with R LE blood clot (03/2017) requiring thrombectomy of femoral artery and femoral-popliteal BPG complicated by repeat thrombosis requiring 4 compartment fasciotomy and several month hospitalization (R LE remains edematous and sensitive to touch with residual  numbness and tingling), osteoporosis, OA, remote h/o lumbar fusion, CAD, COPD, skin cancer    Examination-Activity Limitations  Bed Mobility;Bend;Carry;Lift;Locomotion Level;Sit;Sleep;Squat;Stairs;Stand;Transfers    Examination-Participation Restrictions  Cleaning;Community Activity;Laundry;Meal Prep;Shop    Rehab Potential  Fair    PT Frequency  2x / week    PT Duration  6 weeks    PT Treatment/Interventions  ADLs/Self Care Home Management;Cryotherapy;Electrical Stimulation;Moist Heat;Ultrasound;DME Instruction;Gait training;Functional mobility training;Therapeutic activities;Therapeutic exercise;Neuromuscular re-education;Patient/family education;Manual techniques;Passive range of motion;Dry needling;Taping    PT Next Visit Plan  Progress lumbopelvic flexibility and strengthening moving toward standing activities as tolerated, review of posture and body mechanics education as indicated (particularly with vacuuming), HEP update PRN, manual therapy and modalities PRN    PT Home Exercise Plan  06/03/19 - SKTC, KTOS & LTR stretches, abdominal bracing; 06/10/19 - seated march, seated adduction squeeze, seated yellow TB clam shell, sit<>stand (UE push); 06/17/19 - HEP update - abd bracing + seated yellow TB rows, horiz ABD & UE diagonals; 06/25/19 - standing hip ext & abd    Consulted and Agree with Plan of Care  Patient       Patient will benefit from skilled therapeutic intervention in order to improve the following deficits and impairments:  Abnormal gait, Decreased activity tolerance, Decreased balance, Decreased endurance, Decreased knowledge of precautions, Decreased mobility, Decreased range of motion, Decreased safety awareness, Decreased strength, Difficulty walking, Increased edema, Increased fascial restricitons, Increased muscle spasms, Impaired perceived functional ability, Impaired flexibility, Improper body mechanics, Postural dysfunction, Pain  Visit Diagnosis: Chronic bilateral low back pain  without sciatica  Pain in left hip  Abnormal posture  Muscle weakness (generalized)  Other symptoms and signs involving the musculoskeletal system     Problem List Patient Active Problem List   Diagnosis Date Noted  . Dyslipidemia 06/11/2019  . Atypical chest pain 05/13/2019  . History of smoking 05/13/2019  . Peripheral vascular disease ([place]) 04/22/2019  . 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  . Acute bilateral low back pain without sciatica 04/22/2019  . Onychomadesis of toenail 04/22/2019  . Trochanteric bursitis of left hip 03/26/2019    Izell Belleplain, PT, DPT 07/09/2019, 9:36 AM  [facility] [address]  [place], [place], [address] Phone: [phone]   Fax:  [phone]  Name: Madolyn Ackroyd Holderman MRN: [id number] Date of Birth: [birth year]

## 2019-07-13 ENCOUNTER — Other Ambulatory Visit: Payer: Self-pay

## 2019-07-13 ENCOUNTER — Ambulatory Visit: Payer: Medicare Other

## 2019-07-13 DIAGNOSIS — R29898 Other symptoms and signs involving the musculoskeletal system: Secondary | ICD-10-CM

## 2019-07-13 DIAGNOSIS — M6281 Muscle weakness (generalized): Secondary | ICD-10-CM

## 2019-07-13 DIAGNOSIS — G8929 Other chronic pain: Secondary | ICD-10-CM

## 2019-07-13 DIAGNOSIS — M25552 Pain in left hip: Secondary | ICD-10-CM

## 2019-07-13 DIAGNOSIS — R293 Abnormal posture: Secondary | ICD-10-CM

## 2019-07-13 DIAGNOSIS — M545 Low back pain: Secondary | ICD-10-CM | POA: Diagnosis not present

## 2019-07-13 NOTE — Therapy (Addendum)
[facility] [address]  [place], [place], [address] Phone: [phone]   Fax:  [phone]  Physical Therapy Progress Note / Discharge Summary  Patient Details  Name: Margurite Duffy MRN: [id number] Date of Birth: 10/28/1934 Referring Provider (PT): Clearance Coots, MD  Progress Note  Reporting Period 06/03/2019 to 07/13/2019  See note below for Objective Data and Assessment of Progress/Goals.     Encounter Date: 07/13/2019  PT End of Session - 07/13/19 1009    Visit Number  10    Number of Visits  12    Date for PT Re-Evaluation  07/15/19    Authorization Type  Medicare & AARP    PT Start Time  0848    PT Stop Time  0930    PT Time Calculation (min)  42 min    Activity Tolerance  Patient tolerated treatment well    Behavior During Therapy  [facility] for tasks assessed/performed       Past Medical History:  Diagnosis Date  . Acute bilateral low back pain without sciatica 04/22/2019  . Arthritis   . Atypical chest pain 05/13/2019  . Cancer (HCC)    Basal cell carcinoma  . Cancer (HCC)    Squamous cell carcinoma  . COPD (chronic obstructive pulmonary disease) ([place])   . Coronary artery disease   . History of smoking 05/13/2019  . Hyperlipidemia   . Lipid disorder   . Melanoma ([place])   . Numbness and tingling of right leg    Mild residual  . Onychomadesis of toenail 04/22/2019  . Osteoporosis   . Peripheral arterial disease ([place])   . Peripheral vascular disease ([place]) 04/22/2019  . PONV (postoperative nausea and vomiting)   . Rhinitis   . Shortness of breath   . 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  . Trochanteric bursitis of left hip 03/26/2019  . Wheezing     Past Surgical History:  Procedure Laterality Date  . ANGIOPLASTY    . Arteriography    . COLONOSCOPY    . EYE SURGERY     Laser vision correction  . FASCIOTOMY  03/2017   4 compartment.  . FEMORAL-POPLITEAL BYPASS  GRAFT  03/2017   with Prosthetic for ciritcal lim ischemia, 2/2 thrombosis of existing graft.  . Iliofemoral Endarterectomy Right   . SKIN BIOPSY    . SPINE SURGERY     Fusion, Lumbar  . THROMBECTOMY FEMORAL ARTERY  03/2017    There were no vitals filed for this visit.  Subjective Assessment - 07/13/19 0937    Subjective  Pt reports her pain was gone when she went to bed after her last visit, and she thinks it was because of the clamshells and strengthening her hip. She has not had much pain since then at night and has been "pulling her stomach in more throughout the day". She mentions a few times she really does not like the bike and "does not feel it is what I need". Agreeable to pause PT for 3 weeks to work on strengthening on her own.    Pertinent History  extensive PAD with R LE blood clot (03/2017) requiring thrombectomy of femoral artery and femoral-popliteal BPG complicated by repeat thrombosis requiring 4 compartment fasciotomy and several month hospitalization (R LE remains edematous and sensitive to touch with residual numbness and tingling), osteoporosis, OA, remote h/o lumbar fusion, CAD, COPD, skin cancer    Diagnostic tests  L hip x-ray  03/26/19: The bones appear osteopenic. No acute fracture or dislocation. Mild degenerative changes are identified within both hip joints.    Patient Stated Goals  "to relieve the pain, I guess ... it's not going to happen"    Currently in Pain?  No/denies         [facility] PT Assessment - 07/13/19 0001      ROM / Strength   AROM / PROM / Strength  Strength      Strength   Overall Strength  --   B hips grossly 4-/5                  [facility] Adult PT Treatment/Exercise - 07/13/19 0001      Exercises   Exercises  Knee/Hip      Lumbar Exercises: Standing   Other Standing Lumbar Exercises  reviewed standing PPT at wall with pillow behind back and provided in updated HEP      Lumbar Exercises: Supine   Bridge  15 reps;3 seconds     Bridge Limitations  low bridge with PPT and YTB above knees      Knee/Hip Exercises: Seated   Clamshell with TheraBand  Yellow;Red   reviewed for HEP addition     Knee/Hip Exercises: Supine   Other Supine Knee/Hip Exercises  H/L clams iso 10x5" YTB, uni x 10 B YTB      Knee/Hip Exercises: Sidelying   Clams  L clams 1 x 15 x 5 secs (edu on YTB for home)      Manual Therapy   Manual Therapy  Soft tissue mobilization;Myofascial release;Muscle Energy Technique    Manual therapy comments  R S/L pillow b/t knees    Soft tissue mobilization  STM to lateral HS, TFL, glutes    Myofascial Release  MFR/XFM to TFL    Muscle Energy Technique  MET for L hip ER/abd in clamshells              PT Education - 07/13/19 0938    Education Details  HEP provided for the next 3 weeks to focus on; given pt bands from clinic to utilize to advance to RTB as pt gets stronger    Person(s) Educated  Patient    Methods  Explanation;Demonstration;Tactile cues;Verbal cues;Handout    Comprehension  Verbalized understanding;Returned demonstration;Verbal cues required       PT Short Term Goals - 07/02/19 0913      PT SHORT TERM GOAL #1   Title  Patient will be independent with initial HEP    Status  Achieved   06/22/19       PT Long Term Goals - 07/13/19 1010      PT LONG TERM GOAL #1   Title  Patient will be independent with ongoing/advanced HEP    Status  Partially Met   provided with new HEP for longer term     PT LONG TERM GOAL #2   Title  Patient to demonstrate appropriate posture and body mechanics needed for daily activities    Status  Partially Met   Pt's gait is improving, as well as posture.     PT LONG TERM GOAL #3   Title  Patient to report pain reduction in frequency and intensity by >/= 50%    Status  Partially Met   Pt seems to be very close to meeting this goal with recent report of less pain.     PT LONG TERM GOAL #4   Title  Patient will demonstrate  improved B proximal LE  strength to >/= 4/5 for improved stability and ease of mobility    Status  On-going   currently 4-/5     PT LONG TERM GOAL #5   Title  Patient to report ability to perform mobilty, ADLs and household tasks with decreased pain interference    Status  Partially Met   no report of low back/hip pain at night currently           Plan - 07/13/19 1287    Clinical Impression Statement  Pt presents with considerable improvement in subjective pain report. She carried over improvement in ambulation from last visit with increased LLE stance time and gait speed. She was agreeable to performing HEP for 3 weeks to improve hip/core strength on her own before potentially returning to PT. Pt had no increase in pain during visit and performed all exercises with minimal set-up and good form. Pt was emailed HEP and provided printout with instructions and extra tips for success. Will re-evaluate if/when pt returns in 3 weeks whether continuing PT is appropriate or not.    Personal Factors and Comorbidities  Comorbidity 3+;Time since onset of injury/illness/exacerbation;Fitness;Age;Past/Current Experience    Comorbidities  extensive PAD with R LE blood clot (03/2017) requiring thrombectomy of femoral artery and femoral-popliteal BPG complicated by repeat thrombosis requiring 4 compartment fasciotomy and several month hospitalization (R LE remains edematous and sensitive to touch with residual numbness and tingling), osteoporosis, OA, remote h/o lumbar fusion, CAD, COPD, skin cancer    Examination-Activity Limitations  Bed Mobility;Bend;Carry;Lift;Locomotion Level;Sit;Sleep;Squat;Stairs;Stand;Transfers    Examination-Participation Restrictions  Cleaning;Community Activity;Laundry;Meal Prep;Shop    Rehab Potential  Fair    PT Frequency  2x / week    PT Duration  6 weeks    PT Treatment/Interventions  ADLs/Self Care Home Management;Cryotherapy;Electrical Stimulation;Moist Heat;Ultrasound;DME Instruction;Gait  training;Functional mobility training;Therapeutic activities;Therapeutic exercise;Neuromuscular re-education;Patient/family education;Manual techniques;Passive range of motion;Dry needling;Taping    PT Next Visit Plan  If pt returns, reassess where pt is currently and whether PT is appropriate to continue    PT Home Exercise Plan  06/03/19 - SKTC, KTOS & LTR stretches, abdominal bracing; 06/10/19 - seated march, seated adduction squeeze, seated yellow TB clam shell, sit<>stand (UE push); 06/17/19 - HEP update - abd bracing + seated yellow TB rows, horiz ABD & UE diagonals; 06/25/19 - standing hip ext & abd; 4/26 - S/L L clams with YTB, supine H/L iso clams and uni clams with YTB, supine bridging with YTB, standing PPT at wall with pillow, seated hip abd with YTB as needed for pain    Consulted and Agree with Plan of Care  Patient       Patient will benefit from skilled therapeutic intervention in order to improve the following deficits and impairments:  Abnormal gait, Decreased activity tolerance, Decreased balance, Decreased endurance, Decreased knowledge of precautions, Decreased mobility, Decreased range of motion, Decreased safety awareness, Decreased strength, Difficulty walking, Increased edema, Increased fascial restricitons, Increased muscle spasms, Impaired perceived functional ability, Impaired flexibility, Improper body mechanics, Postural dysfunction, Pain  Visit Diagnosis: Chronic bilateral low back pain without sciatica  Other symptoms and signs involving the musculoskeletal system  Pain in left hip  Abnormal posture  Muscle weakness (generalized)     Problem List Patient Active Problem List   Diagnosis Date Noted  . Dyslipidemia 06/11/2019  . Atypical chest pain 05/13/2019  . History of smoking 05/13/2019  . Peripheral vascular disease ([place]) 04/22/2019  . Squamous cell carcinoma in situ (SCCIS) of skin  04/22/2019  . Acute bilateral low back pain without sciatica 04/22/2019   . Onychomadesis of toenail 04/22/2019  . Trochanteric bursitis of left hip 03/26/2019    Izell [place], PT, DPT 07/13/2019, 10:11 AM  [facility] [address]  [place], [place], [address] Phone: [phone]   Fax:  [phone]  Name: Kobe Jansma Coad MRN: [id number] Date of Birth: Apr 15, 1934  PHYSICAL THERAPY DISCHARGE SUMMARY  Visits from Start of Care: 10  Current functional level related to goals / functional outcomes:   Refer to above clinical impression for status as of last visit on 07/13/2019. Patient was placed on hold for 3 weeks with planned return visit on 08/03/2019, however patient cancelled this appointment due to medical issues and wanting to follow up with MD, therefore will proceed with discharge from PT for this episode.   Remaining deficits:   As above.    Education / Equipment:  HEP  Plan: Patient agrees to discharge.  Patient goals were partially met. Patient is being discharged due to meeting the stated rehab goals.  ?????     Percival Spanish, PT, MPT 08/11/19, 1:44 PM  [facility] [address]  [place], [place], [address] Phone: [phone]   Fax:  [phone]

## 2019-07-17 ENCOUNTER — Ambulatory Visit (INDEPENDENT_AMBULATORY_CARE_PROVIDER_SITE_OTHER): Payer: Medicare Other | Admitting: Medical

## 2019-07-17 DIAGNOSIS — R197 Diarrhea, unspecified: Secondary | ICD-10-CM | POA: Diagnosis not present

## 2019-07-17 NOTE — Patient Instructions (Addendum)
For recent diarrhea advised hydrate well stressed with propel, pedialyte or ginger ale as you note have this at home. Stop pepto Continue immodium  Bland diet as explained.   Asked to come in now for stool panel study and labs. She can't and no ride available.  If worsening/increasing then ED evaluation. Explained concern for dehydration.  Follow up early next week or as needed

## 2019-07-17 NOTE — Progress Notes (Signed)
   Subjective:    Patient ID: Holly Briggs, female    DOB: 05/10/1934, 85 y.o.   MRN: [id number]  HPI  Virtual Visit via Telephone Note  I connected with Kiani Besselman Handler on 07/17/19 at 11:00 AM EDT by telephone and verified that I am speaking with the correct person using two identifiers.  Pt did not take vitals signs.  Location: Patient: home Provider: office   I discussed the limitations, risks, security and privacy concerns of performing an evaluation and management service by telephone and the availability of in person appointments. I also discussed with the patient that there may be a patient responsible charge related to this service. The patient expressed understanding and agreed to proceed.   History of Present Illness:  Pt states since yesterday morning she had loose stools all day. Less today.   She had 4 loose stools in the morning and continued during the day. She started to take imodium and pepto bismal. She is describing loose stools present still but more formed and less frequent. No fever, no chills, no sweats, no body aheads, no nausea and no vomiting..   Pt states she can't come in today.   Pt has been taking immodium and pepto.  Pt thinks maybe milk caused as had  some before. No other suspicious foods.   No recent antibiotics.         Observations/Objective: General- no acute distress, pleasant, alert, oriented and normal speech.  Assessment and Plan: Hydrate well stressed with propel, pedialyte or ginger ale as you note have this at home. Stop pepto Continue immodium  Bland diet as explained.   Asked to come in now for stool panel study and labs. She can't and no ride available.  If worsening/increasing then ED evaluation. Explained concern for dehydration.  Follow up early next week or as needed  Follow Up Instructions:    I discussed the assessment and treatment plan with the patient. The patient was provided an  opportunity to ask questions and all were answered. The patient agreed with the plan and demonstrated an understanding of the instructions.   The patient was advised to call back or seek an in-person evaluation if the symptoms worsen or if the condition fails to improve as anticipated.  I provided  15 minutes of non-face-to-face time during this encounter.   Mackie Pai, PA-C   Review of Systems     Objective:   Physical Exam        Assessment & Plan:

## 2019-07-21 ENCOUNTER — Telehealth (HOSPITAL_COMMUNITY): Payer: Self-pay

## 2019-07-21 NOTE — Telephone Encounter (Signed)

## 2019-07-22 ENCOUNTER — Ambulatory Visit (INDEPENDENT_AMBULATORY_CARE_PROVIDER_SITE_OTHER)
Admission: RE | Admit: 2019-07-22 | Discharge: 2019-07-22 | Disposition: A | Discharge status: Home / Self Care | Payer: Medicare Other | Source: Ambulatory Visit | Attending: Vascular Surgery | Admitting: Vascular Surgery

## 2019-07-22 ENCOUNTER — Other Ambulatory Visit: Payer: Self-pay

## 2019-07-22 ENCOUNTER — Ambulatory Visit (HOSPITAL_COMMUNITY)
Admission: RE | Admit: 2019-07-22 | Discharge: 2019-07-22 | Disposition: A | Discharge status: Home / Self Care | Payer: Medicare Other | Source: Ambulatory Visit | Attending: Vascular Surgery | Admitting: Vascular Surgery

## 2019-07-22 ENCOUNTER — Ambulatory Visit (INDEPENDENT_AMBULATORY_CARE_PROVIDER_SITE_OTHER): Payer: Medicare Other | Admitting: Physician Assistant

## 2019-07-22 VITALS — BP 137/81 | HR 82 | Temp 97.1°F | Resp 16 | Ht 62.0 in | Wt 115.5 lb

## 2019-07-22 DIAGNOSIS — I739 Peripheral vascular disease, unspecified: Secondary | ICD-10-CM

## 2019-07-22 NOTE — Progress Notes (Signed)
HISTORY AND PHYSICAL     CC:  follow up. Requesting Provider:  Mackie Pai, PA-C  HPI: This is a 85 y.o. female who is here today for follow up.  She was seen by Dr. Donnetta Hutching in January 2021 and at that time, she had recently moved to this area from the [facility] region.  She has a complicated past vascular history.  She underwent an initial right femoral to popliteal bypass in 2010.  I do not have documentation of whether this was vein or prosthetic.  She presented emergently 2 years ago with an occluded femoral to popliteal bypass and underwent emergent common and deep femoral thrombectomy and right femoral to below-knee popliteal bypass with Gore-Tex.  Also underwent 4 compartment fasciotomy.  She eventually healed all of her wounds and returned to her baseline function.  When she was seen in January, she was not having any claudication sx and was able to walk without difficulty.  She did have some mild swelling that had persisted in the right foot.    She has hx of osteoporosis with hip and back discomfort, which is what limits her.    The pt returns today for follow up.  She states she has been doing well.  She moved from [place], [place] to [place] about 5 months ago as she did not have family in [place].  She denies any claudication or non healing wounds.  She continues to have numbness in the lower part of the right leg.   She is on Eliquis for graft patency.    She is limited by her osteoporosis.  She is working with PT.  She has an area on the distal portion of the right leg that she is concerned about skin cancer.  She has appt with dermatologist later this month.    The pt is on a statin for cholesterol management.    The pt is on an aspirin.    Other AC:  Eliquis The pt is not on meds for hypertension.  The pt does not have diabetes. Tobacco hx:  Former-quit 2008   Past Medical History:  Diagnosis Date  . Acute bilateral low back pain without sciatica 04/22/2019  .  Arthritis   . Atypical chest pain 05/13/2019  . Cancer (HCC)    Basal cell carcinoma  . Cancer (HCC)    Squamous cell carcinoma  . COPD (chronic obstructive pulmonary disease) ([place])   . Coronary artery disease   . History of smoking 05/13/2019  . Hyperlipidemia   . Lipid disorder   . Melanoma ([place])   . Numbness and tingling of right leg    Mild residual  . Onychomadesis of toenail 04/22/2019  . Osteoporosis   . Peripheral arterial disease ([place])   . Peripheral vascular disease ([place]) 04/22/2019  . PONV (postoperative nausea and vomiting)   . Rhinitis   . Shortness of breath   . 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  . Trochanteric bursitis of left hip 03/26/2019  . Wheezing     Past Surgical History:  Procedure Laterality Date  . ANGIOPLASTY    . Arteriography    . COLONOSCOPY    . EYE SURGERY     Laser vision correction  . FASCIOTOMY  03/2017   4 compartment.  . FEMORAL-POPLITEAL BYPASS GRAFT  03/2017   with Prosthetic for ciritcal lim ischemia, 2/2 thrombosis of existing graft.  . Iliofemoral Endarterectomy Right   . SKIN BIOPSY    . SPINE  SURGERY     Fusion, Lumbar  . THROMBECTOMY FEMORAL ARTERY  03/2017    Allergies  Allergen Reactions  . Latex   . Penicillins     "Mouth broke out in sores"  . Prednisone Diarrhea  . Tape     Current Outpatient Medications  Medication Sig Dispense Refill  . apixaban (ELIQUIS) 2.5 MG TABS tablet Take 1 tablet (2.5 mg total) by mouth 2 (two) times daily. 60 tablet 5  . aspirin EC 81 MG tablet Take 81 mg by mouth daily.    Marland Kitchen atorvastatin (LIPITOR) 80 MG tablet Take 1 tablet (80 mg total) by mouth daily. 90 tablet 3  . methylPREDNISolone (MEDROL) 4 MG tablet 4 tab po am and pm. 3 tab po am and pm 2 tab po am and pm. 1 tab po am and pm (Patient not taking: Reported on 07/17/2019) 20 tablet 0  . Riboflavin (VITAMIN B-2 PO) Take 1 tablet by mouth once a week.    . Vitamin D, Ergocalciferol, (DRISDOL) 1.25 MG (50000  UT) CAPS capsule Take 1 capsule (50,000 Units total) by mouth every 7 (seven) days. 8 capsule 2   No current facility-administered medications for this visit.    Family History  Problem Relation Age of Onset  . Hyperlipidemia Mother   . Pneumonia Father   . Hyperlipidemia Sister   . Hyperlipidemia Brother   . Osteoporosis Sister     Social History   Socioeconomic History  . Marital status: Divorced    Spouse name: Not on file  . Number of children: Not on file  . Years of education: Not on file  . Highest education level: Not on file  Occupational History  . Not on file  Tobacco Use  . Smoking status: Former Smoker    Packs/day: 1.00    Years: 50.00    Pack years: 50.00    Types: Cigarettes    Quit date: 03/17/2007    Years since quitting: 12.3  . Smokeless tobacco: Never Used  Substance and Sexual Activity  . Alcohol use: Never  . Drug use: Never  . Sexual activity: Not Currently    Birth control/protection: None  Other Topics Concern  . Not on file  Social History Narrative  . Not on file   Social Determinants of Health   Financial Resource Strain:   . Difficulty of Paying Living Expenses:   Food Insecurity:   . Worried About Charity fundraiser in the Last Year:   . Arboriculturist in the Last Year:   Transportation Needs:   . Film/video editor (Medical):   Marland Kitchen Lack of Transportation (Non-Medical):   Physical Activity:   . Days of Exercise per Week:   . Minutes of Exercise per Session:   Stress:   . Feeling of Stress :   Social Connections:   . Frequency of Communication with Friends and Family:   . Frequency of Social Gatherings with Friends and Family:   . Attends Religious Services:   . Active Member of Clubs or Organizations:   . Attends Archivist Meetings:   Marland Kitchen Marital Status:   Intimate Partner Violence:   . Fear of Current or Ex-Partner:   . Emotionally Abused:   Marland Kitchen Physically Abused:   . Sexually Abused:      REVIEW OF  SYSTEMS:   [X]  denotes positive finding, [ ]  denotes negative finding Cardiac  Comments:  Chest pain or chest pressure:    Shortness of  breath upon exertion:    Short of breath when lying flat:    Irregular heart rhythm:        Vascular    Pain in calf, thigh, or hip brought on by ambulation:    Pain in feet at night that wakes you up from your sleep:     Blood clot in your veins:    Leg swelling:         Pulmonary    Oxygen at home:    Productive cough:     Wheezing:         Neurologic    Sudden weakness in arms or legs:     Sudden numbness in arms or legs:     Sudden onset of difficulty speaking or slurred speech:    Temporary loss of vision in one eye:     Problems with dizziness:         Gastrointestinal    Blood in stool:     Vomited blood:         Genitourinary    Burning when urinating:     Blood in urine:        Psychiatric    Major depression:         Hematologic    Bleeding problems:    Problems with blood clotting too easily:        Skin    Rashes or ulcers:        Constitutional    Fever or chills:      PHYSICAL EXAMINATION:  Today's Vitals   07/22/19 1102  BP: 137/81  Pulse: 82  Resp: 16  Temp: (!) 97.1 F (36.2 C)  TempSrc: Temporal  SpO2: 98%  Weight: 115 lb 8 oz (52.4 kg)  Height: 5\' 2"  (1.575 m)   Body mass index is 21.13 kg/m.   General:  WDWN in NAD; vital signs documented above Gait: Not observed HENT: WNL, normocephalic Pulmonary: normal non-labored breathing , without Rales, rhonchi,  wheezing Cardiac: regular HR, without  Murmurs; without carotid bruits Abdomen: soft, NT, no masses Skin: without rashes; right groin and right lower leg wounds healed nicely.  Vascular Exam/Pulses:  Right Left  Radial 2+ (normal) 2+ (normal)  Ulnar 1+ (weak) 1+ (weak)  Femoral 2+ (normal) 2+ (normal)  Popliteal Unable to palpate  Unable to palpate   DP 2+ (normal) 2+ (normal)  PT 2+ (normal) 2+ (normal)   Extremities: without  ischemic changes, without Gangrene , without cellulitis; without open wounds; motor and sensation are in tact.   Musculoskeletal: no muscle wasting or atrophy  Neurologic: A&O X 3;  No focal weakness or paresthesias are detected Psychiatric:  The pt has Normal affect.   Non-Invasive Vascular Imaging:   ABI's/TBI's on 07/22/2019: Right:  1.10/0.60 - Great toe pressure: 81 Left:  1.19/0.77 - Great toe pressure: 104   Arterial duplex on 07/22/2019: Right Graft #1:  +------------------+--------+--------+---------+--------+           PSV cm/sStenosisWaveform Comments  +------------------+--------+--------+---------+--------+  Inflow      132       triphasic      +------------------+--------+--------+---------+--------+  Prox Anastomosis 100       triphasic      +------------------+--------+--------+---------+--------+  Proximal Graft  63       biphasic       +------------------+--------+--------+---------+--------+  Mid Graft     48       biphasic       +------------------+--------+--------+---------+--------+  Distal Graft   51  triphasic      +------------------+--------+--------+---------+--------+  Distal Anastomosis41       biphasic       +------------------+--------+--------+---------+--------+  Outflow      65       biphasic       +------------------+--------+--------+---------+--------+  Summary:  Right: Patent right femoral to below knee popliteal bypass graft with no  evidence for restenosis.  Previous ABI's/TBI's on 03/24/2019: Right:  1.0/0.42 - Great toe pressure: 69 Left:  1.02/0.70 - Great toe pressure:  114   ASSESSMENT/PLAN:: 85 y.o. female here for follow up for PAD with hx of right femoral to popliteal bypass and redo prosthetic right femoral to BK popliteal bypass and 4 compartment fasciotomy a couple of years ago  in [place], [place] and recently established care here in [place].    PAD: -ABI's are essentially unchanged and are 1.0 bilaterally and the pt is asymtomatic.  She does have palpable pedal pulses bilaterally.  Discussed with Dr. Oneida Alar about decreased velocity at the distal anastomosis-given she has palpable pulses and is asymptomatic, will have her return in 6 months with arterial duplex and ABI's.  She will call us sooner should she have any issues.  -since the pt is new to the area, we discussed should she develop any symptoms of ischemia, she would proceed to [facility].    Leontine Locket, [facility] [phone]  Clinic MD:   Scot Dock

## 2019-07-24 ENCOUNTER — Other Ambulatory Visit: Payer: Self-pay | Admitting: *Deleted

## 2019-07-24 DIAGNOSIS — I739 Peripheral vascular disease, unspecified: Secondary | ICD-10-CM

## 2019-07-29 ENCOUNTER — Ambulatory Visit: Payer: Medicare Other | Admitting: Cardiology

## 2019-08-03 ENCOUNTER — Ambulatory Visit: Payer: Medicare Other | Admitting: Physical Therapy

## 2019-08-06 ENCOUNTER — Ambulatory Visit: Payer: Medicare Other | Admitting: Cardiology

## 2019-08-06 ENCOUNTER — Ambulatory Visit: Payer: Medicare Other | Admitting: Family Medicine

## 2019-08-10 ENCOUNTER — Ambulatory Visit: Payer: Medicare Other | Admitting: Physical Therapy

## 2019-08-10 NOTE — Progress Notes (Signed)
Subjective:   Holly Briggs is a 85 y.o. female who presents for an Initial Medicare Annual Wellness Visit.  Enjoys reading.   Review of Systems     Home Safety/Smoke Alarms: Feels safe in home. Smoke alarms in place.  Lives alone w/ 3 cats in 1 story home. .Brother lives close.   Female:     Mammo- 05/06/19      Dexa scan-   Declines. Pt states "I am not doing any more screenings unless absolutely necessary."     Objective:    Today's Vitals   08/11/19 0912  BP: (!) 149/78  Pulse: 88  Temp: (!) 97 F (36.1 C)  TempSrc: Temporal  SpO2: 98%  Weight: 112 lb (50.8 kg)  Height: 5\' 2"  (1.575 m)   Body mass index is 20.49 kg/m.  Advanced Directives 08/11/2019 06/03/2019 03/24/2019  Does Patient Have a Medical Advance Directive? Yes Yes No  Type of Paramedic of [place];Living will [place];Living will -  Does patient want to make changes to medical advance directive? No - Patient declined No - Patient declined -  Copy of [place] in Chart? No - copy requested No - copy requested -  Would patient like information on creating a medical advance directive? - - No - Patient declined    Current Medications (verified) Outpatient Encounter Medications as of 08/11/2019  Medication Sig  . acetaminophen (TYLENOL) 325 MG tablet Take 325 mg by mouth. May take 3 tablets daily as needed for pain  . apixaban (ELIQUIS) 2.5 MG TABS tablet Take 1 tablet (2.5 mg total) by mouth 2 (two) times daily.  Marland Kitchen aspirin EC 81 MG tablet Take 81 mg by mouth daily.  Marland Kitchen atorvastatin (LIPITOR) 80 MG tablet Take 1 tablet (80 mg total) by mouth daily.  . budesonide-formoterol (SYMBICORT) 80-4.5 MCG/ACT inhaler Inhale 2 puffs into the lungs 2 (two) times daily.  Marland Kitchen ezetimibe (ZETIA) 10 MG tablet Take 1 tablet (10 mg total) by mouth daily.  . Vitamin D, Ergocalciferol, (DRISDOL) 1.25 MG (50000 UT) CAPS capsule Take 1 capsule (50,000 Units  total) by mouth every 7 (seven) days.  . [DISCONTINUED] methylPREDNISolone (MEDROL) 4 MG tablet 4 tab po am and pm. 3 tab po am and pm 2 tab po am and pm. 1 tab po am and pm (Patient not taking: Reported on 07/17/2019)  . [DISCONTINUED] Riboflavin (VITAMIN B-2 PO) Take 1 tablet by mouth once a week.   No facility-administered encounter medications on file as of 08/11/2019.    Allergies (verified) Latex, Penicillins, Prednisone, and Tape   History: Past Medical History:  Diagnosis Date  . Acute bilateral low back pain without sciatica 04/22/2019  . Arthritis   . Atypical chest pain 05/13/2019  . Cancer (HCC)    Basal cell carcinoma  . Cancer (HCC)    Squamous cell carcinoma  . COPD (chronic obstructive pulmonary disease) ([place])   . Coronary artery disease   . History of smoking 05/13/2019  . Hyperlipidemia   . Lipid disorder   . Melanoma ([place])   . Numbness and tingling of right leg    Mild residual  . Onychomadesis of toenail 04/22/2019  . Osteoporosis   . Peripheral arterial disease ([place])   . Peripheral vascular disease ([place]) 04/22/2019  . PONV (postoperative nausea and vomiting)   . Rhinitis   . Shortness of breath   . 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  . Trochanteric  bursitis of left hip 03/26/2019  . Wheezing    Past Surgical History:  Procedure Laterality Date  . ANGIOPLASTY    . Arteriography    . COLONOSCOPY    . EYE SURGERY     Laser vision correction  . FASCIOTOMY  03/2017   4 compartment.  . FEMORAL-POPLITEAL BYPASS GRAFT  03/2017   with Prosthetic for ciritcal lim ischemia, 2/2 thrombosis of existing graft.  . Iliofemoral Endarterectomy Right   . SKIN BIOPSY    . SPINE SURGERY     Fusion, Lumbar  . THROMBECTOMY FEMORAL ARTERY  03/2017   Family History  Problem Relation Age of Onset  . Hyperlipidemia Mother   . Pneumonia Father   . Hyperlipidemia Sister   . Hyperlipidemia Brother   . Osteoporosis Sister    Social History    Socioeconomic History  . Marital status: Divorced    Spouse name: Not on file  . Number of children: Not on file  . Years of education: Not on file  . Highest education level: Not on file  Occupational History  . Not on file  Tobacco Use  . Smoking status: Former Smoker    Packs/day: 1.00    Years: 50.00    Pack years: 50.00    Types: Cigarettes    Quit date: 03/17/2007    Years since quitting: 12.4  . Smokeless tobacco: Never Used  Substance and Sexual Activity  . Alcohol use: Never  . Drug use: Never  . Sexual activity: Not Currently    Birth control/protection: None  Other Topics Concern  . Not on file  Social History Narrative  . Not on file   Social Determinants of Health   Financial Resource Strain: Low Risk   . Difficulty of Paying Living Expenses: Not hard at all  Food Insecurity: No Food Insecurity  . Worried About Charity fundraiser in the Last Year: Never true  . Ran Out of Food in the Last Year: Never true  Transportation Needs: No Transportation Needs  . Lack of Transportation (Medical): No  . Lack of Transportation (Non-Medical): No  Physical Activity:   . Days of Exercise per Week:   . Minutes of Exercise per Session:   Stress:   . Feeling of Stress :   Social Connections:   . Frequency of Communication with Friends and Family:   . Frequency of Social Gatherings with Friends and Family:   . Attends Religious Services:   . Active Member of Clubs or Organizations:   . Attends Archivist Meetings:   Marland Kitchen Marital Status:     Tobacco Counseling Counseling given: Not Answered   Clinical Intake: Pain : No/denies pain    Activities of Daily Living In your present state of health, do you have any difficulty performing the following activities: 08/11/2019  Hearing? N  Vision? N  Difficulty concentrating or making decisions? N  Walking or climbing stairs? N  Dressing or bathing? N  Doing errands, shopping? N  Preparing Food and eating  ? N  Using the Toilet? N  In the past six months, have you accidently leaked urine? N  Do you have problems with loss of bowel control? N  Managing your Medications? N  Managing your Finances? N  Housekeeping or managing your Housekeeping? N  Some recent data might be hidden     Immunizations and Health Maintenance  There is no immunization history on file for this patient. Health Maintenance Due  Topic Date Due  .  COVID-19 Vaccine (1) Never done  . TETANUS/TDAP  Never done  . DEXA SCAN  Never done  . PNA vac Low Risk Adult (1 of 2 - PCV13) Never done    Patient Care Team: Saguier, Iris Pert as PCP - General (Internal Medicine)  Indicate any recent Medical Services you may have received from other than [facility] providers in the past year (date may be approximate).     Assessment:   This is a routine wellness examination for Holly Briggs.  Hearing/Vision screen  Hearing Screening   125Hz  250Hz  500Hz  1000Hz  2000Hz  3000Hz  4000Hz  6000Hz  8000Hz   Right ear:           Left ear:           Comments: Declines. Pt states "I am not doing any more screenings unless absolutely necessary."  Vision Screening Comments: Declines. Pt states "I am not doing any more screenings unless absolutely necessary."   Dietary issues and exercise activities discussed: Current Exercise Habits: The patient does not participate in regular exercise at present, Exercise limited by: None identified     Goals    . Increase physical activity      Depression Screen PHQ 2/9 Scores 08/11/2019 03/25/2019  PHQ - 2 Score 0 0  PHQ- 9 Score - 0    Fall Risk Fall Risk  08/11/2019  Falls in the past year? 0  Number falls in past yr: 0  Injury with Fall? 0  Follow up Education provided;Falls prevention discussed     Cognitive Function: Ad8 score reviewed for issues:  Issues making decisions:no  Less interest in hobbies / activities:no  Repeats questions, stories (family complaining):no  Trouble using  ordinary gadgets (microwave, computer, phone):no  Forgets the month or year: no  Mismanaging finances: no  Remembering appts:no  Daily problems with thinking and/or memory:no Ad8 score is=0         Screening Tests Health Maintenance  Topic Date Due  . COVID-19 Vaccine (1) Never done  . TETANUS/TDAP  Never done  . DEXA SCAN  Never done  . PNA vac Low Risk Adult (1 of 2 - PCV13) Never done  . INFLUENZA VACCINE  10/18/2019       Plan:    Please schedule your next medicare wellness visit with me in 1 yr.  Continue to eat heart healthy diet (full of fruits, vegetables, whole grains, lean protein, water--limit salt, fat, and sugar intake) and increase physical activity as tolerated.  Continue doing brain stimulating activities (puzzles, reading, adult coloring books, staying active) to keep memory sharp.   Bring a copy of your living will and/or healthcare power of attorney to your next office visit.   I have personally reviewed and noted the following in the patient's chart:   . Medical and social history . Use of alcohol, tobacco or illicit drugs  . Current medications and supplements . Functional ability and status . Nutritional status . Physical activity . Advanced directives . List of other physicians . Hospitalizations, surgeries, and ER visits in previous 12 months . Vitals . Screenings to include cognitive, depression, and falls . Referrals and appointments  In addition, I have reviewed and discussed with patient certain preventive protocols, quality metrics, and best practice recommendations. A written personalized care plan for preventive services as well as general preventive health recommendations were provided to patient.     Naaman Plummer [place], [place]   08/11/2019

## 2019-08-11 ENCOUNTER — Ambulatory Visit (INDEPENDENT_AMBULATORY_CARE_PROVIDER_SITE_OTHER): Payer: Medicare Other | Admitting: *Deleted

## 2019-08-11 ENCOUNTER — Encounter: Payer: Self-pay | Admitting: *Deleted

## 2019-08-11 ENCOUNTER — Ambulatory Visit (INDEPENDENT_AMBULATORY_CARE_PROVIDER_SITE_OTHER): Payer: Medicare Other | Admitting: Medical

## 2019-08-11 ENCOUNTER — Telehealth: Payer: Self-pay | Admitting: Medical

## 2019-08-11 ENCOUNTER — Encounter: Payer: Self-pay | Admitting: Medical

## 2019-08-11 ENCOUNTER — Other Ambulatory Visit: Payer: Self-pay

## 2019-08-11 VITALS — BP 135/78 | HR 88 | Resp 18 | Ht 62.0 in | Wt 112.0 lb

## 2019-08-11 VITALS — BP 149/78 | HR 88 | Temp 97.0°F | Ht 62.0 in | Wt 112.0 lb

## 2019-08-11 DIAGNOSIS — R0789 Other chest pain: Secondary | ICD-10-CM | POA: Diagnosis not present

## 2019-08-11 DIAGNOSIS — F329 Major depressive disorder, single episode, unspecified: Secondary | ICD-10-CM

## 2019-08-11 DIAGNOSIS — I739 Peripheral vascular disease, unspecified: Secondary | ICD-10-CM | POA: Diagnosis not present

## 2019-08-11 DIAGNOSIS — R5383 Other fatigue: Secondary | ICD-10-CM

## 2019-08-11 DIAGNOSIS — Z0000 Encounter for general adult medical examination without abnormal findings: Secondary | ICD-10-CM

## 2019-08-11 DIAGNOSIS — R0600 Dyspnea, unspecified: Secondary | ICD-10-CM | POA: Diagnosis not present

## 2019-08-11 DIAGNOSIS — E785 Hyperlipidemia, unspecified: Secondary | ICD-10-CM

## 2019-08-11 DIAGNOSIS — F32A Depression, unspecified: Secondary | ICD-10-CM

## 2019-08-11 LAB — TSH: TSH: 1.03 u[IU]/mL (ref 0.35–4.50)

## 2019-08-11 LAB — VITAMIN B12: Vitamin B-12: 211 pg/mL (ref 211–911)

## 2019-08-11 MED ORDER — EZETIMIBE 10 MG PO TABS: 10.0000 mg | ORAL_TABLET | Freq: Every day | ORAL | 3 refills | Status: DC

## 2019-08-11 MED ORDER — BUDESONIDE-FORMOTEROL FUMARATE 80-4.5 MCG/ACT IN AERO
2.0000 | INHALATION_SPRAY | Freq: Two times a day (BID) | RESPIRATORY_TRACT | 3 refills | Status: DC
Start: 2019-08-11 — End: 2020-02-26

## 2019-08-11 NOTE — Patient Instructions (Addendum)
You do have history of recent atypical chest pain.  You are being seen by cardiologist I do recommend that you keep appointment with Dr. Agustin Cree this coming week.  We will also see what cardiologist at [facility] thinks as well.  I sent in prescription of Zetia for your high cholesterol.  Continue current statin.  If schedule appointment with any significant chest pain prior to that appointment then recommend ED evaluation.  For peripheral vascular disease, continue with Eliquis and aspirin.  We will see what vascular surgeon at [facility] thinks as he is already made second opinion appointment.  You have had some depression about your overall health status but recently improved.  Could offer low-dose Effexor in the future if you agree to that.  Presently that was declined.  History of dyspnea recently on exertion.  50-year history of smoking before he stopped.  Will get chest x-ray and go ahead and refer you to pulmonologist.  In addition go ahead and start Symbicort inhaler.  Hopefully this will help.  For fatigue, will get TSH, B12 and B1 level.   Follow-up with me in 3 to 4 weeks or as needed.

## 2019-08-11 NOTE — Patient Instructions (Addendum)
Plan:    Please schedule your next medicare wellness visit with me in 1 yr.  Continue to eat heart healthy diet (full of fruits, vegetables, whole grains, lean protein, water--limit salt, fat, and sugar intake) and increase physical activity as tolerated.  Continue doing brain stimulating activities (puzzles, reading, adult coloring books, staying active) to keep memory sharp.   Bring a copy of your living will and/or healthcare power of attorney to your next office visit.   Ms. Lovins , Thank you for taking time to come for your Medicare Wellness Visit. I appreciate your ongoing commitment to your health goals. Please review the following plan we discussed and let me know if I can assist you in the future.   These are the goals we discussed: Goals    . Increase physical activity       This is a list of the screening recommended for you and due dates:  Health Maintenance  Topic Date Due  . COVID-19 Vaccine (1) Never done  . Tetanus Vaccine  Never done  . DEXA scan (bone density measurement)  Never done  . Pneumonia vaccines (1 of 2 - PCV13) Never done  . Flu Shot  10/18/2019    Preventive Care 65 Years and Older, Female Preventive care refers to lifestyle choices and visits with your health care provider that can promote health and wellness. This includes:  A yearly physical exam. This is also called an annual well check.  Regular dental and eye exams.  Immunizations.  Screening for certain conditions.  Healthy lifestyle choices, such as diet and exercise. What can I expect for my preventive care visit? Physical exam Your health care provider will check:  Height and weight. These may be used to calculate body mass index (BMI), which is a measurement that tells if you are at a healthy weight.  Heart rate and blood pressure.  Your skin for abnormal spots. Counseling Your health care provider may ask you questions about:  Alcohol, tobacco, and drug use.  Emotional  well-being.  Home and relationship well-being.  Sexual activity.  Eating habits.  History of falls.  Memory and ability to understand (cognition).  Work and work Statistician.  Pregnancy and menstrual history. What immunizations do I need?  Influenza (flu) vaccine  This is recommended every year. Tetanus, diphtheria, and pertussis (Tdap) vaccine  You may need a Td booster every 10 years. Varicella (chickenpox) vaccine  You may need this vaccine if you have not already been vaccinated. Zoster (shingles) vaccine  You may need this after age 52. Pneumococcal conjugate (PCV13) vaccine  One dose is recommended after age 37. Pneumococcal polysaccharide (PPSV23) vaccine  One dose is recommended after age 55. Measles, mumps, and rubella (MMR) vaccine  You may need at least one dose of MMR if you were born in 1957 or later. You may also need a second dose. Meningococcal conjugate (MenACWY) vaccine  You may need this if you have certain conditions. Hepatitis A vaccine  You may need this if you have certain conditions or if you travel or work in places where you may be exposed to hepatitis A. Hepatitis B vaccine  You may need this if you have certain conditions or if you travel or work in places where you may be exposed to hepatitis B. Haemophilus influenzae type b (Hib) vaccine  You may need this if you have certain conditions. You may receive vaccines as individual doses or as more than one vaccine together in one shot (combination  vaccines). Talk with your health care provider about the risks and benefits of combination vaccines. What tests do I need? Blood tests  Lipid and cholesterol levels. These may be checked every 5 years, or more frequently depending on your overall health.  Hepatitis C test.  Hepatitis B test. Screening  Lung cancer screening. You may have this screening every year starting at age 9 if you have a 30-pack-year history of smoking and  currently smoke or have quit within the past 15 years.  Colorectal cancer screening. All adults should have this screening starting at age 67 and continuing until age 68. Your health care provider may recommend screening at age 39 if you are at increased risk. You will have tests every 1-10 years, depending on your results and the type of screening test.  Diabetes screening. This is done by checking your blood sugar (glucose) after you have not eaten for a while (fasting). You may have this done every 1-3 years.  Mammogram. This may be done every 1-2 years. Talk with your health care provider about how often you should have regular mammograms.  BRCA-related cancer screening. This may be done if you have a family history of breast, ovarian, tubal, or peritoneal cancers. Other tests  Sexually transmitted disease (STD) testing.  Bone density scan. This is done to screen for osteoporosis. You may have this done starting at age 27. Follow these instructions at home: Eating and drinking  Eat a diet that includes fresh fruits and vegetables, whole grains, lean protein, and low-fat dairy products. Limit your intake of foods with high amounts of sugar, saturated fats, and salt.  Take vitamin and mineral supplements as recommended by your health care provider.  Do not drink alcohol if your health care provider tells you not to drink.  If you drink alcohol: ? Limit how much you have to 0-1 drink a day. ? Be aware of how much alcohol is in your drink. In the [place] one drink equals one 12 oz bottle of beer (355 mL), one 5 oz glass of wine (148 mL), or one 1 oz glass of hard liquor (44 mL). Lifestyle  Take daily care of your teeth and gums.  Stay active. Exercise for at least 30 minutes on 5 or more days each week.  Do not use any products that contain nicotine or tobacco, such as cigarettes, e-cigarettes, and chewing tobacco. If you need help quitting, ask your health care provider.  If you are  sexually active, practice safe sex. Use a condom or other form of protection in order to prevent STIs (sexually transmitted infections).  Talk with your health care provider about taking a low-dose aspirin or statin. What's next?  Go to your health care provider once a year for a well check visit.  Ask your health care provider how often you should have your eyes and teeth checked.  Stay up to date on all vaccines. This information is not intended to replace advice given to you by your health care provider. Make sure you discuss any questions you have with your health care provider. Document Revised: 02/27/2018 Document Reviewed: 02/27/2018 Elsevier Patient Education  2020 [employer].

## 2019-08-11 NOTE — Progress Notes (Signed)
Subjective:    Patient ID: Holly Briggs, female    DOB: [birth year], 85 y.o.   MRN: [id number]  HPI  Pt in for follow up.   Hx of lower ext vascular disease. Has seen vascular surgeon. Expresses may need second opinion. Will see vascular next week at [facility].  Pt also expresses may need second opinion from cardiologist. Follow up with Dr. Kirkland Hun first. She in the end did desire 2nd opinion.    Plan from Dr. Kirkland Hun,  1. Peripheral vascular disease quite advanced she is already on Eliquis as well as aspirin which I will continue.  She is being followed by vascular surgeon. 2. Atypical chest pain looks very nonspecific and worse with pressing not related to exercise help , pain improved while she is wearing prior overall very nonspecific characteristic.  However, because of her extensive vascular problem we will probably end up doing some cardiac work-up as well she tells me that she did have something done by her cardiologist from [place] I will try to get records and see what was done I would anticipate to do in the future if nothing was done recently will be stress testing.  However again the pain she described to me today is very atypical. 3. History of smoking likely she quit I encouraged her to stay away from smoking 4. Dyslipidemia she is taking high intensity statin which I will continue.  Last cholesterol profile that I have is from January with LDL of 76 and HDL of 53.  Will add Zetia to her medical regimen.  Her LDL need to be less than 70.    Pt went to PT. She states PT was pessimistic about her back pain.   Overall she states very unhappy about her health  Pt also has some feet pain. She complains about podiatrist that did not trim her nails.   Trouble with crown recently. Dentist still working with her.  Pt wishes she never moved since health problems worsened since move.   She states feels fatigued all the time. Some depressed. No thoughts  of harm to self or others.      Review of Systems  Constitutional: Positive for fatigue. Negative for chills and fever.  HENT: Negative for congestion.   Respiratory: Positive for shortness of breath. Negative for cough, chest tightness and wheezing.        Shortness of breath at times and has hx of smoking stopped 11 years ago.  Cardiovascular: Negative for chest pain and palpitations.  Gastrointestinal: Negative for abdominal pain, diarrhea, rectal pain and vomiting.  Skin: Negative for rash.  Neurological: Negative for dizziness, seizures, syncope, weakness and headaches.  Psychiatric/Behavioral: Positive for dysphoric mood. The patient is not nervous/anxious.        Pt states recently better. She declines medication.   Past Medical History:  Diagnosis Date  . Acute bilateral low back pain without sciatica 04/22/2019  . Arthritis   . Atypical chest pain 05/13/2019  . Cancer (HCC)    Basal cell carcinoma  . Cancer (HCC)    Squamous cell carcinoma  . COPD (chronic obstructive pulmonary disease) ([place])   . Coronary artery disease   . History of smoking 05/13/2019  . Hyperlipidemia   . Lipid disorder   . Melanoma ([place])   . Numbness and tingling of right leg    Mild residual  . Onychomadesis of toenail 04/22/2019  . Osteoporosis   . Peripheral arterial disease ([place])   . Peripheral vascular disease (  [place]) 04/22/2019  . PONV (postoperative nausea and vomiting)   . Rhinitis   . Shortness of breath   . 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  . Trochanteric bursitis of left hip 03/26/2019  . Wheezing      Social History   Socioeconomic History  . Marital status: Divorced    Spouse name: Not on file  . Number of children: Not on file  . Years of education: Not on file  . Highest education level: Not on file  Occupational History  . Not on file  Tobacco Use  . Smoking status: Former Smoker    Packs/day: 1.00    Years: 50.00    Pack years: 50.00    Types:  Cigarettes    Quit date: 03/17/2007    Years since quitting: 12.4  . Smokeless tobacco: Never Used  Substance and Sexual Activity  . Alcohol use: Never  . Drug use: Never  . Sexual activity: Not Currently    Birth control/protection: None  Other Topics Concern  . Not on file  Social History Narrative  . Not on file   Social Determinants of Health   Financial Resource Strain:   . Difficulty of Paying Living Expenses:   Food Insecurity:   . Worried About Charity fundraiser in the Last Year:   . Arboriculturist in the Last Year:   Transportation Needs:   . Film/video editor (Medical):   Marland Kitchen Lack of Transportation (Non-Medical):   Physical Activity:   . Days of Exercise per Week:   . Minutes of Exercise per Session:   Stress:   . Feeling of Stress :   Social Connections:   . Frequency of Communication with Friends and Family:   . Frequency of Social Gatherings with Friends and Family:   . Attends Religious Services:   . Active Member of Clubs or Organizations:   . Attends Archivist Meetings:   Marland Kitchen Marital Status:   Intimate Partner Violence:   . Fear of Current or Ex-Partner:   . Emotionally Abused:   Marland Kitchen Physically Abused:   . Sexually Abused:     Past Surgical History:  Procedure Laterality Date  . ANGIOPLASTY    . Arteriography    . COLONOSCOPY    . EYE SURGERY     Laser vision correction  . FASCIOTOMY  03/2017   4 compartment.  . FEMORAL-POPLITEAL BYPASS GRAFT  03/2017   with Prosthetic for ciritcal lim ischemia, 2/2 thrombosis of existing graft.  . Iliofemoral Endarterectomy Right   . SKIN BIOPSY    . SPINE SURGERY     Fusion, Lumbar  . THROMBECTOMY FEMORAL ARTERY  03/2017    Family History  Problem Relation Age of Onset  . Hyperlipidemia Mother   . Pneumonia Father   . Hyperlipidemia Sister   . Hyperlipidemia Brother   . Osteoporosis Sister     Allergies  Allergen Reactions  . Latex   . Penicillins     "Mouth broke out in sores"   . Prednisone Diarrhea  . Tape     Current Outpatient Medications on File Prior to Visit  Medication Sig Dispense Refill  . acetaminophen (TYLENOL) 325 MG tablet Take 325 mg by mouth. May take 3 tablets daily as needed for pain    . apixaban (ELIQUIS) 2.5 MG TABS tablet Take 1 tablet (2.5 mg total) by mouth 2 (two) times daily. 60 tablet 5  . aspirin EC 81 MG  tablet Take 81 mg by mouth daily.    Marland Kitchen atorvastatin (LIPITOR) 80 MG tablet Take 1 tablet (80 mg total) by mouth daily. 90 tablet 3  . Vitamin D, Ergocalciferol, (DRISDOL) 1.25 MG (50000 UT) CAPS capsule Take 1 capsule (50,000 Units total) by mouth every 7 (seven) days. 8 capsule 2  . methylPREDNISolone (MEDROL) 4 MG tablet 4 tab po am and pm. 3 tab po am and pm 2 tab po am and pm. 1 tab po am and pm (Patient not taking: Reported on 07/17/2019) 20 tablet 0  . Riboflavin (VITAMIN B-2 PO) Take 1 tablet by mouth once a week.     No current facility-administered medications on file prior to visit.    BP (!) 149/78 (BP Location: Left Arm, Patient Position: Sitting, Cuff Size: Normal)   Pulse 88   Resp 18   Ht 5\' 2"  (1.575 m)   Wt 112 lb (50.8 kg)   SpO2 98%   BMI 20.49 kg/m       Objective:   Physical Exam   General- No acute distress. Pleasant patient. Neck- Full range of motion, no jvd Lungs- Clear, even and unlabored. Heart- regular rate and rhythm. Neurologic- CNII- XII grossly intact. Lower ext- no obvious pedal edema     Assessment & Plan:  You do have history of recent atypical chest pain.  You are being seen by cardiologist I do recommend that you keep appointment with Dr. Agustin Cree this coming week.  We will also see what cardiologist at [facility] thinks as well.  I sent in prescription of Zetia for your high cholesterol.  Continue current statin.  If schedule appointment with any significant chest pain prior to that appointment then recommend ED evaluation.  For peripheral vascular disease, continue with  Eliquis and aspirin.  We will see what vascular surgeon at [facility] thinks as he is already made second opinion appointment.  You have had some depression about your overall health status but recently improved.  Could offer low-dose Effexor in the future if you agree to that.  Presently that was declined.  History of dyspnea recently on exertion.  50-year history of smoking before he stopped.  Will get chest x-ray and go ahead and refer you to pulmonologist.  In addition go ahead and start Symbicort inhaler.  Hopefully this will help.  For fatigue, will get TSH, B12 and B1 level.  Follow-up with me in 3 to 4 weeks or as needed.  Mackie Pai, PA-C   Time spent with patient today was 45  minutes which consisted of chart review, discussing diagnoses, work u,p treatment, referral, answering questions and documentation.

## 2019-08-11 NOTE — Telephone Encounter (Signed)
Pt has seen Dr. Donnetta Hutching. Pt has seen PA for vascular clinic. Pt has appointment with Dr. Donnetta Hutching. She has complaints about vascular office. She thought receptionist was rude. She is stating she can't handle prospect of further vascular complications.  Pt has seen Dr. Kirkland Hun. complained about his office. Waited to long. She states that office was very rude and abrupt. She states told will return to cardiologist one more time. Has appointment next week.

## 2019-08-12 ENCOUNTER — Encounter: Payer: Self-pay | Admitting: Medical

## 2019-08-12 ENCOUNTER — Telehealth: Payer: Self-pay | Admitting: Medical

## 2019-08-12 NOTE — Telephone Encounter (Signed)
Sent pt my charge message.

## 2019-08-12 NOTE — Telephone Encounter (Signed)
Patient states she was unable to respond on mychart to your questions. But she wanted you to know that she prefers to take the b12  Shots in office. She also wanted to know that since she is  Put on  Ezetmibe  On yesterday do she still take the Atorvastain as well. Please send her a message back on Mychart to respond or call

## 2019-08-14 LAB — VITAMIN B1: Vitamin B1 (Thiamine): 12 nmol/L (ref 8–30)

## 2019-08-18 ENCOUNTER — Ambulatory Visit (INDEPENDENT_AMBULATORY_CARE_PROVIDER_SITE_OTHER): Payer: Medicare Other

## 2019-08-18 ENCOUNTER — Other Ambulatory Visit: Payer: Self-pay

## 2019-08-18 ENCOUNTER — Ambulatory Visit (HOSPITAL_BASED_OUTPATIENT_CLINIC_OR_DEPARTMENT_OTHER)
Admission: RE | Admit: 2019-08-18 | Discharge: 2019-08-18 | Disposition: A | Discharge status: Home / Self Care | Payer: Medicare Other | Source: Ambulatory Visit | Attending: Medical | Admitting: Medical

## 2019-08-18 DIAGNOSIS — R0600 Dyspnea, unspecified: Secondary | ICD-10-CM | POA: Diagnosis not present

## 2019-08-18 DIAGNOSIS — E538 Deficiency of other specified B group vitamins: Secondary | ICD-10-CM | POA: Diagnosis not present

## 2019-08-18 MED ORDER — CYANOCOBALAMIN 1000 MCG/ML IJ SOLN
1000.0000 ug | Freq: Once | INTRAMUSCULAR | Status: AC
  Administered 2019-08-18: 1000 ug via INTRAMUSCULAR

## 2019-08-18 NOTE — Progress Notes (Addendum)
Pt here for monthly B12 injection per Mackie Pai  B12 1065mcg given IM in left deltoid, and pt tolerated injection well.  Next B12 injection scheduled for next month  Agree with injection of b12  For vit B12 deficiency.  Mackie Pai, PA-C

## 2019-08-21 ENCOUNTER — Encounter: Payer: Self-pay | Admitting: Cardiology

## 2019-08-21 ENCOUNTER — Telehealth (HOSPITAL_COMMUNITY): Payer: Self-pay | Admitting: Cardiology

## 2019-08-21 ENCOUNTER — Telehealth: Payer: Self-pay | Admitting: Medical

## 2019-08-21 ENCOUNTER — Other Ambulatory Visit: Payer: Self-pay

## 2019-08-21 ENCOUNTER — Ambulatory Visit (INDEPENDENT_AMBULATORY_CARE_PROVIDER_SITE_OTHER): Payer: Medicare Other | Admitting: Cardiology

## 2019-08-21 VITALS — BP 112/72 | HR 94 | Ht 62.0 in | Wt 117.4 lb

## 2019-08-21 DIAGNOSIS — I739 Peripheral vascular disease, unspecified: Secondary | ICD-10-CM

## 2019-08-21 DIAGNOSIS — Z87891 Personal history of nicotine dependence: Secondary | ICD-10-CM

## 2019-08-21 DIAGNOSIS — E785 Hyperlipidemia, unspecified: Secondary | ICD-10-CM | POA: Diagnosis not present

## 2019-08-21 DIAGNOSIS — R0789 Other chest pain: Secondary | ICD-10-CM | POA: Diagnosis not present

## 2019-08-21 NOTE — Telephone Encounter (Signed)
I referred pt to cardiologist and pulmonologist. New referral to [facility] at pt request. Will you give her number to office that we are referring her to. That way she can call herself to follow up.

## 2019-08-21 NOTE — Progress Notes (Signed)
Cardiology Office Note:    Date:  08/21/2019   ID:  Holly Briggs, DOB 03-01-1935, MRN [id number]  PCP:  Mackie Pai, PA-C  Cardiologist:  Jenne Campus, MD    Referring MD: Elise Benne   Chief Complaint  Patient presents with  . Follow-up    2 MO FU   Doing better after B12 injections  History of Present Illness:    Holly Briggs is a 85 y.o. female   who is being seen today for the evaluation of chest painat the request of [place], Percell Miller, [place].She is a lady with quite incredible peripheral vascular disease history. She recently moved into the area from [facility]. Her past medical history include femoral to popliteal bypass in 2010, then about 2 years ago she presented emergently to the hospital with occluded femoral to popliteal bypass and underwent emergent, deep femoral thrombectomy with right femoral to below-knee poplitealbypass with Goretex. She also underwent 4 compartment fasciotomy. She eventually healed completely and she was discharged home. She is on Eliquis as well as aspirin. She has a little understanding what happened to her leg. All the information above are from note by vascular surgeon who seen her recently. I will request office records to get more detail. She denies having any heart trouble. Denies having any intervention done on her heart. She recalls having some test on her heart and apparently everything was fine. She was referred to [place] because of atypical chest pain.  Not related to exercise happen only when she does not wear a bra.  Overall looks very atypical.  However I wanted to get her records to see if she got any cardiac work-up done before finally I did receive the records which I reviewed carefully.  They were about 100 pages.  And I did not find any evaluation of her coronary arteries. She denies have any dizziness or passing out, she was sent to physical therapy for her back and legs and  she is doing quite well from that aspect. She comes today 2 months of follow-up.  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  She does not have any TIA/CVA symptoms.  Sadly she did not have any of her cardiac test done.  Denies have any chest pain tightness squeezing pressure burning chest chest fatigue tiredness.  She said when she is trying to vacuum her room she would get short of breath and complain of having back pain.  Past Medical History:  Diagnosis Date  . Acute bilateral low back pain without sciatica 04/22/2019  . Arthritis   . Atypical chest pain 05/13/2019  . Cancer (HCC)    Basal cell carcinoma  . Cancer (HCC)    Squamous cell carcinoma  . COPD (chronic obstructive pulmonary disease) ([place])   . Coronary artery disease   . History of smoking 05/13/2019  . Hyperlipidemia   . Lipid disorder   . Melanoma ([place])   . Numbness and tingling of right leg    Mild residual  . Onychomadesis of toenail 04/22/2019  . Osteoporosis   . Peripheral arterial disease ([place])   . Peripheral vascular disease ([place]) 04/22/2019  . PONV (postoperative nausea and vomiting)   . Rhinitis   . Shortness of breath   . 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  . Trochanteric bursitis of left hip 03/26/2019  . Wheezing     Past Surgical History:  Procedure Laterality Date  .  ANGIOPLASTY    . Arteriography    . COLONOSCOPY    . EYE SURGERY     Laser vision correction  . FASCIOTOMY  03/2017   4 compartment.  . FEMORAL-POPLITEAL BYPASS GRAFT  03/2017   with Prosthetic for ciritcal lim ischemia, 2/2 thrombosis of existing graft.  . Iliofemoral Endarterectomy Right   . SKIN BIOPSY    . SPINE SURGERY     Fusion, Lumbar  . THROMBECTOMY FEMORAL ARTERY  03/2017    Current Medications: Current Meds  Medication Sig  . acetaminophen (TYLENOL) 325 MG tablet Take 325 mg by mouth. May take 3 tablets daily as needed for pain  . apixaban  (ELIQUIS) 2.5 MG TABS tablet Take 1 tablet (2.5 mg total) by mouth 2 (two) times daily.  Marland Kitchen aspirin EC 81 MG tablet Take 81 mg by mouth daily.  Marland Kitchen atorvastatin (LIPITOR) 80 MG tablet Take 1 tablet (80 mg total) by mouth daily.  Marland Kitchen ezetimibe (ZETIA) 10 MG tablet Take 1 tablet (10 mg total) by mouth daily.  . Vitamin D, Ergocalciferol, (DRISDOL) 1.25 MG (50000 UT) CAPS capsule Take 1 capsule (50,000 Units total) by mouth every 7 (seven) days.     Allergies:   Latex, Penicillins, Prednisone, and Tape   Social History   Socioeconomic History  . Marital status: Divorced    Spouse name: Not on file  . Number of children: Not on file  . Years of education: Not on file  . Highest education level: Not on file  Occupational History  . Not on file  Tobacco Use  . Smoking status: Former Smoker    Packs/day: 1.00    Years: 50.00    Pack years: 50.00    Types: Cigarettes    Quit date: 03/17/2007    Years since quitting: 12.4  . Smokeless tobacco: Never Used  Substance and Sexual Activity  . Alcohol use: Never  . Drug use: Never  . Sexual activity: Not Currently    Birth control/protection: None  Other Topics Concern  . Not on file  Social History Narrative  . Not on file   Social Determinants of Health   Financial Resource Strain: Low Risk   . Difficulty of Paying Living Expenses: Not hard at all  Food Insecurity: No Food Insecurity  . Worried About Charity fundraiser in the Last Year: Never true  . Ran Out of Food in the Last Year: Never true  Transportation Needs: No Transportation Needs  . Lack of Transportation (Medical): No  . Lack of Transportation (Non-Medical): No  Physical Activity:   . Days of Exercise per Week:   . Minutes of Exercise per Session:   Stress:   . Feeling of Stress :   Social Connections:   . Frequency of Communication with Friends and Family:   . Frequency of Social Gatherings with Friends and Family:   . Attends Religious Services:   . Active Member  of Clubs or Organizations:   . Attends Archivist Meetings:   Marland Kitchen Marital Status:      Family History: The patient's family history includes Hyperlipidemia in her brother, mother, and sister; Osteoporosis in her sister; Pneumonia in her father. ROS:   Please see the history of present illness.    All 14 point review of systems negative except as described per history of present illness  EKGs/Labs/Other Studies Reviewed:      Recent Labs: 03/26/2019: Hemoglobin 12.7; Platelets 261.0 06/02/2019: ALT 26; BUN 20; Creatinine, Ser  0.92; Potassium 4.8; Sodium 142 08/11/2019: TSH 1.03  Recent Lipid Panel    Component Value Date/Time   CHOL 138 06/11/2019 0925   TRIG 124 06/11/2019 0925   HDL 53 06/11/2019 0925   CHOLHDL 2.6 06/11/2019 0925   CHOLHDL 3 03/26/2019 0749   VLDL 18.6 03/26/2019 0749   LDLCALC 63 06/11/2019 0925    Physical Exam:    VS:  BP 112/72   Pulse 94   Ht 5\' 2"  (1.575 m)   Wt 117 lb 6.4 oz (53.3 kg)   SpO2 99%   BMI 21.47 kg/m     Wt Readings from Last 3 Encounters:  08/21/19 117 lb 6.4 oz (53.3 kg)  08/11/19 112 lb (50.8 kg)  08/11/19 112 lb (50.8 kg)     GEN:  Well nourished, well developed in no acute distress HEENT: Normal NECK: No JVD; No carotid bruits LYMPHATICS: No lymphadenopathy CARDIAC: RRR, no murmurs, no rubs, no gallops RESPIRATORY:  Clear to auscultation without rales, wheezing or rhonchi  ABDOMEN: Soft, non-tender, non-distended MUSCULOSKELETAL:  No edema; No deformity  SKIN: Warm and dry LOWER EXTREMITIES: no swelling NEUROLOGIC:  Alert and oriented x 3 PSYCHIATRIC:  Normal affect   ASSESSMENT:    1. Peripheral vascular disease ([place])   2. History of smoking   3. Dyslipidemia   4. Atypical chest pain    PLAN:    In order of problems listed above:  1. Peripheral vascular disease.  Carotid ultrasounds analyzed with the patient no critical lesions.  We will continue present management.  Will repeat carotid ultrasounds  in within the next 6 months. 2. History of smoking does not smoke anymore. 3. Dyslipidemia she is on high intensity statin as well as Zetia which I will continue.  Her LDL is 63 her HDL is 53 that is a good number.  We will continue present management. 4. Atypical chest pain for some reason echocardiogram and stress test has not been done.  We will schedule her to test.  She does have central device peripheral vascular disease as well as some atypical symptoms and we need to make sure she does not have any significant/critical coronary artery disease.   Medication Adjustments/Labs and Tests Ordered: Current medicines are reviewed at length with the patient today.  Concerns regarding medicines are outlined above.  No orders of the defined types were placed in this encounter.  Medication changes: No orders of the defined types were placed in this encounter.   Signed, Park Liter, MD, [facility] 08/21/2019 8:52 AM    [place]

## 2019-08-21 NOTE — Patient Instructions (Signed)
Medication Instructions:  Your physician recommends that you continue on your current medications as directed. Please refer to the Current Medication list given to you today.  *If you need a refill on your cardiac medications before your next appointment, please call your pharmacy*   Lab Work: None.  If you have labs (blood work) drawn today and your tests are completely normal, you will receive your results only by: Marland Kitchen MyChart Message (if you have MyChart) OR . A paper copy in the mail If you have any lab test that is abnormal or we need to change your treatment, we will call you to review the results.   Testing/Procedures: Your physician has requested that you have an echocardiogram. Echocardiography is a painless test that uses sound waves to create images of your heart. It provides your doctor with information about the size and shape of your heart and how well your heart's chambers and valves are working. This procedure takes approximately one hour. There are no restrictions for this procedure.    [facility] at [facility] [address] [place], [place] [address] Phone: [phone]    Please arrive 15 minutes prior to your appointment time for registration and insurance purposes.  The test will take approximately 3 to 4 hours to complete; you may bring reading material.  If someone comes with you to your appointment, they will need to remain in the main lobby due to limited space in the testing area. **If you are pregnant or breastfeeding, please notify the nuclear lab prior to your appointment**  How to prepare for your Myocardial Perfusion Test: . Do not eat or drink 3 hours prior to your test, except you may have water. . Do not consume products containing caffeine (regular or decaffeinated) 12 hours prior to your test. (ex: coffee, chocolate, sodas, tea). . Do bring a list of your current medications with you.  If not listed below,  you may take your medications as normal. . Do wear comfortable clothes (no dresses or overalls) and walking shoes, tennis shoes preferred (No heels or open toe shoes are allowed). . Do NOT wear cologne, perfume, aftershave, or lotions (deodorant is allowed). . If these instructions are not followed, your test will have to be rescheduled.  Please report to [address] for your test.  If you have questions or concerns about your appointment, you can call the Nuclear Lab at [phone].  If you cannot keep your appointment, please provide 24 hours notification to the Nuclear Lab, to avoid a possible $50 charge to your account.    Follow-Up: At [facility], you and your health needs are our priority.  As part of our continuing mission to provide you with exceptional heart care, we have created designated Provider Care Teams.  These Care Teams include your primary Cardiologist (physician) and Advanced Practice Providers (APPs -  Physician Assistants and Nurse Practitioners) who all work together to provide you with the care you need, when you need it.  We recommend signing up for the patient portal called "MyChart".  Sign up information is provided on this After Visit Summary.  MyChart is used to connect with patients for Virtual Visits (Telemedicine).  Patients are able to view lab/test results, encounter notes, upcoming appointments, etc.  Non-urgent messages can be sent to your provider as well.   To learn more about what you can do with MyChart, go to [email or web address].    Your next appointment:  4 month(s)  The format for your next appointment:   In Person  Provider:   Jenne Campus, MD   Other Instructions   Echocardiogram An echocardiogram is a procedure that uses painless sound waves (ultrasound) to produce an image of the heart. Images from an echocardiogram can provide important information about:  Signs of coronary artery disease  (CAD).  Aneurysm detection. An aneurysm is a weak or damaged part of an artery wall that bulges out from the normal force of blood pumping through the body.  Heart size and shape. Changes in the size or shape of the heart can be associated with certain conditions, including heart failure, aneurysm, and CAD.  Heart muscle function.  Heart valve function.  Signs of a past heart attack.  Fluid buildup around the heart.  Thickening of the heart muscle.  A tumor or infectious growth around the heart valves. Tell a health care provider about:  Any allergies you have.  All medicines you are taking, including vitamins, herbs, eye drops, creams, and over-the-counter medicines.  Any blood disorders you have.  Any surgeries you have had.  Any medical conditions you have.  Whether you are pregnant or may be pregnant. What are the risks? Generally, this is a safe procedure. However, problems may occur, including:  Allergic reaction to dye (contrast) that may be used during the procedure. What happens before the procedure? No specific preparation is needed. You may eat and drink normally. What happens during the procedure?   An IV tube may be inserted into one of your veins.  You may receive contrast through this tube. A contrast is an injection that improves the quality of the pictures from your heart.  A gel will be applied to your chest.  A wand-like tool (transducer) will be moved over your chest. The gel will help to transmit the sound waves from the transducer.  The sound waves will harmlessly bounce off of your heart to allow the heart images to be captured in real-time motion. The images will be recorded on a computer. The procedure may vary among health care providers and hospitals. What happens after the procedure?  You may return to your normal, everyday life, including diet, activities, and medicines, unless your health care provider tells you not to do  that. Summary  An echocardiogram is a procedure that uses painless sound waves (ultrasound) to produce an image of the heart.  Images from an echocardiogram can provide important information about the size and shape of your heart, heart muscle function, heart valve function, and fluid buildup around your heart.  You do not need to do anything to prepare before this procedure. You may eat and drink normally.  After the echocardiogram is completed, you may return to your normal, everyday life, unless your health care provider tells you not to do that. This information is not intended to replace advice given to you by your health care provider. Make sure you discuss any questions you have with your health care provider. Document Revised: 06/26/2018 Document Reviewed: 04/07/2016 Elsevier Patient Education  2020 [place].   Cardiac Nuclear Scan A cardiac nuclear scan is a test that measures blood flow to the heart when a person is resting and when he or she is exercising. The test looks for problems such as:  Not enough blood reaching a portion of the heart.  The heart muscle not working normally. You may need this test if:  You have heart disease.  You have had abnormal  lab results.  You have had heart surgery or a balloon procedure to open up blocked arteries (angioplasty).  You have chest pain.  You have shortness of breath. In this test, a radioactive dye (tracer) is injected into your bloodstream. After the tracer has traveled to your heart, an imaging device is used to measure how much of the tracer is absorbed by or distributed to various areas of your heart. This procedure is usually done at a hospital and takes 2-4 hours. Tell a health care provider about:  Any allergies you have.  All medicines you are taking, including vitamins, herbs, eye drops, creams, and over-the-counter medicines.  Any problems you or family members have had with anesthetic medicines.  Any  blood disorders you have.  Any surgeries you have had.  Any medical conditions you have.  Whether you are pregnant or may be pregnant. What are the risks? Generally, this is a safe procedure. However, problems may occur, including:  Serious chest pain and heart attack. This is only a risk if the stress portion of the test is done.  Rapid heartbeat.  Sensation of warmth in your chest. This usually passes quickly.  Allergic reaction to the tracer. What happens before the procedure?  Ask your health care provider about changing or stopping your regular medicines. This is especially important if you are taking diabetes medicines or blood thinners.  Follow instructions from your health care provider about eating or drinking restrictions.  Remove your jewelry on the day of the procedure. What happens during the procedure?  An IV will be inserted into one of your veins.  Your health care provider will inject a small amount of radioactive tracer through the IV.  You will wait for 20-40 minutes while the tracer travels through your bloodstream.  Your heart activity will be monitored with an electrocardiogram (ECG).  You will lie down on an exam table.  Images of your heart will be taken for about 15-20 minutes.  You may also have a stress test. For this test, one of the following may be done: ? You will exercise on a treadmill or stationary bike. While you exercise, your heart's activity will be monitored with an ECG, and your blood pressure will be checked. ? You will be given medicines that will increase blood flow to parts of your heart. This is done if you are unable to exercise.  When blood flow to your heart has peaked, a tracer will again be injected through the IV.  After 20-40 minutes, you will get back on the exam table and have more images taken of your heart.  Depending on the type of tracer used, scans may need to be repeated 3-4 hours later.  Your IV line will be  removed when the procedure is over. The procedure may vary among health care providers and hospitals. What happens after the procedure?  Unless your health care provider tells you otherwise, you may return to your normal schedule, including diet, activities, and medicines.  Unless your health care provider tells you otherwise, you may increase your fluid intake. This will help to flush the contrast dye from your body. Drink enough fluid to keep your urine pale yellow.  Ask your health care provider, or the department that is doing the test: ? When will my results be ready? ? How will I get my results? Summary  A cardiac nuclear scan measures the blood flow to the heart when a person is resting and when he or she  is exercising.  Tell your health care provider if you are pregnant.  Before the procedure, ask your health care provider about changing or stopping your regular medicines. This is especially important if you are taking diabetes medicines or blood thinners.  After the procedure, unless your health care provider tells you otherwise, increase your fluid intake. This will help flush the contrast dye from your body.  After the procedure, unless your health care provider tells you otherwise, you may return to your normal schedule, including diet, activities, and medicines. This information is not intended to replace advice given to you by your health care provider. Make sure you discuss any questions you have with your health care provider. Document Revised: 08/19/2017 Document Reviewed: 08/19/2017 Elsevier Patient Education  2020 [employer]vier Inc.  

## 2019-08-21 NOTE — Telephone Encounter (Signed)
Opened to review 

## 2019-08-21 NOTE — Telephone Encounter (Signed)
Dr. Agustin Cree, I called patient to schedule Lexiscan and she declined to schedule.  She states that she has had the test in the past and just doesn't want to ever do that again. I have removed the Order from the WQ and if patient calls at later date to ever reschedule we can reinstate order or put in new one. Thank you so much.  Lattie Haw

## 2019-08-24 NOTE — Telephone Encounter (Signed)
Both referrals have been sent by Cora Daniels

## 2019-08-27 ENCOUNTER — Other Ambulatory Visit: Payer: Self-pay

## 2019-08-27 ENCOUNTER — Encounter: Payer: Self-pay | Admitting: Family Medicine

## 2019-08-27 ENCOUNTER — Ambulatory Visit (INDEPENDENT_AMBULATORY_CARE_PROVIDER_SITE_OTHER): Payer: Medicare Other | Admitting: Family Medicine

## 2019-08-27 DIAGNOSIS — M545 Low back pain, unspecified: Secondary | ICD-10-CM

## 2019-08-27 DIAGNOSIS — Z87891 Personal history of nicotine dependence: Secondary | ICD-10-CM | POA: Insufficient documentation

## 2019-08-27 DIAGNOSIS — R0600 Dyspnea, unspecified: Secondary | ICD-10-CM | POA: Insufficient documentation

## 2019-08-27 DIAGNOSIS — E785 Hyperlipidemia, unspecified: Secondary | ICD-10-CM | POA: Insufficient documentation

## 2019-08-27 DIAGNOSIS — R0609 Other forms of dyspnea: Secondary | ICD-10-CM | POA: Insufficient documentation

## 2019-08-27 DIAGNOSIS — R9431 Abnormal electrocardiogram [ECG] [EKG]: Secondary | ICD-10-CM | POA: Insufficient documentation

## 2019-08-27 NOTE — Progress Notes (Signed)
Medication Samples have been provided to the patient.  Drug name: Pennsaid       Strength: 2%        Qty: 3 Boxes LOT: [id number]  Exp.Date: 03/2020  Dosing instructions: Use a pea size amount and rub gently.  The patient has been instructed regarding the correct time, dose, and frequency of taking this medication, including desired effects and most common side effects.   Sherrie George, MA 9:58 AM 08/27/2019

## 2019-08-27 NOTE — Patient Instructions (Signed)
Good to see you Please try the pennsaid  Please try the exercises  Please follow up with me sooner if the pain is ongoing and we can try an injection   Please send me a message in MyChart with any questions or updates.  Please see me back in 6-8 weeks.   --Dr. Raeford Razor

## 2019-08-27 NOTE — Assessment & Plan Note (Addendum)
Symptoms could be more related to the facet joints or the SI joint as opposed to being localized to the greater trochanter.  She did not receive much improvement with the previous injection. -Counseled on home exercise therapy and supportive care. -Provided Pennsaid samples. -Provided Glucerna and Ensure samples. -Her vitamin D has been on the low end of normal.  Could supplement to see if that helps improve her symptoms as well. -Could consider SI joint injection

## 2019-08-27 NOTE — Progress Notes (Signed)
Medication Samples have been provided to the patient.  Ensure Max Protein Nutrition Shake Chocolate  Qty: 2  (11oz.) Bottles         LOT: [id number] Exp.Date: 9OQX5702  Ensure Max Protein Nutrition Shake French Vanilla  Qty: 2 (11oz) Bottles. LOT: [id number]   Exp.Date: [id number]  Dosing instructions: Use as directed.  The patient has been instructed regarding the correct time, dose, and frequency of taking this medication, including desired effects and most common side effects.   Sherrie George, [place] 10:02 AM 08/27/2019

## 2019-08-27 NOTE — Progress Notes (Signed)
Holly Briggs - 85 y.o. female MRN [id number]  Date of birth: [birth year]  SUBJECTIVE:  Including CC & ROS.  Chief Complaint  Patient presents with  . Follow-up    left hip    Holly Briggs is a 85 y.o. female that is presenting with worsening low back and left-sided leg pain.  She received a greater trochanter.jsinject .  Marland Kitchen  She is having some popping of the left knee.  She has been through physical therapy.  The pain seems to be worse with certain movements.  Denies any radicular symptoms.   Review of Systems See HPI   HISTORY: Past Medical, Surgical, Social, and Family History Reviewed & Updated per EMR.   Pertinent Historical Findings include:  Past Medical History:  Diagnosis Date  . Acute bilateral low back pain without sciatica 04/22/2019  . Arthritis   . Atypical chest pain 05/13/2019  . Cancer (HCC)    Basal cell carcinoma  . Cancer (HCC)    Squamous cell carcinoma  . COPD (chronic obstructive pulmonary disease) ([place])   . Coronary artery disease   . History of smoking 05/13/2019  . Hyperlipidemia   . Lipid disorder   . Melanoma ([place])   . Numbness and tingling of right leg    Mild residual  . Onychomadesis of toenail 04/22/2019  . Osteoporosis   . Peripheral arterial disease ([place])   . Peripheral vascular disease ([place]) 04/22/2019  . PONV (postoperative nausea and vomiting)   . Rhinitis   . Shortness of breath   . 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  . Trochanteric bursitis of left hip 03/26/2019  . Wheezing     Past Surgical History:  Procedure Laterality Date  . ANGIOPLASTY    . Arteriography    . COLONOSCOPY    . EYE SURGERY     Laser vision correction  . FASCIOTOMY  03/2017   4 compartment.  . FEMORAL-POPLITEAL BYPASS GRAFT  03/2017   with Prosthetic for ciritcal lim ischemia, 2/2 thrombosis of existing graft.  . Iliofemoral Endarterectomy Right   . SKIN BIOPSY    . SPINE SURGERY     Fusion, Lumbar  .  THROMBECTOMY FEMORAL ARTERY  03/2017    Family History  Problem Relation Age of Onset  . Hyperlipidemia Mother   . Pneumonia Father   . Hyperlipidemia Sister   . Hyperlipidemia Brother   . Osteoporosis Sister     Social History   Socioeconomic History  . Marital status: Divorced    Spouse name: Not on file  . Number of children: Not on file  . Years of education: Not on file  . Highest education level: Not on file  Occupational History  . Not on file  Tobacco Use  . Smoking status: Former Smoker    Packs/day: 1.00    Years: 50.00    Pack years: 50.00    Types: Cigarettes    Quit date: 03/17/2007    Years since quitting: 12.4  . Smokeless tobacco: Never Used  Vaping Use  . Vaping Use: Never used  Substance and Sexual Activity  . Alcohol use: Never  . Drug use: Never  . Sexual activity: Not Currently    Birth control/protection: None  Other Topics Concern  . Not on file  Social History Narrative  . Not on file   Social Determinants of Health   Financial Resource Strain: Low Risk   . Difficulty of Paying Living Expenses: Not hard at all  Food Insecurity: No Food Insecurity  . Worried About Charity fundraiser in the Last Year: Never true  . Ran Out of Food in the Last Year: Never true  Transportation Needs: No Transportation Needs  . Lack of Transportation (Medical): No  . Lack of Transportation (Non-Medical): No  Physical Activity:   . Days of Exercise per Week:   . Minutes of Exercise per Session:   Stress:   . Feeling of Stress :   Social Connections:   . Frequency of Communication with Friends and Family:   . Frequency of Social Gatherings with Friends and Family:   . Attends Religious Services:   . Active Member of Clubs or Organizations:   . Attends Archivist Meetings:   Marland Kitchen Marital Status:   Intimate Partner Violence:   . Fear of Current or Ex-Partner:   . Emotionally Abused:   Marland Kitchen Physically Abused:   . Sexually Abused:       PHYSICAL EXAM:  VS: BP (!) 141/88   Pulse 97   Ht 5\' 2"  (1.575 m)   Wt 115 lb (52.2 kg)   BMI 21.03 kg/m  Physical Exam Gen: NAD, alert, cooperative with exam, well-appearing MSK:  Back: Significant scoliosis. Tenderness appears more over the gluteus and hip abductors. No specific tenderness over the SI joints. Normal internal and external rotation of the hips. Neurovascularly intact     ASSESSMENT & PLAN:   Acute bilateral low back pain without sciatica Symptoms could be more related to the facet joints or the SI joint as opposed to being localized to the greater trochanter.  She did not receive much improvement with the previous injection. -Counseled on home exercise therapy and supportive care. -Provided Pennsaid samples. -Provided Glucerna and Ensure samples. -Her vitamin D has been on the low end of normal.  Could supplement to see if that helps improve her symptoms as well. -Could consider SI joint injection

## 2019-08-27 NOTE — Progress Notes (Signed)
Medication Samples have been provided to the patient.  Glucerna Shake       Strawberry  Qty: 4 (10oz) Bottles LOT: [id number] Exp.Date: 03/31/2019  Dosing instructions: Use as directed  The patient has been instructed regarding the correct time, dose, and frequency of taking this medication, including desired effects and most common side effects.   Sherrie George, MA 10:14 AM 08/27/2019

## 2019-09-05 ENCOUNTER — Other Ambulatory Visit: Payer: Self-pay | Admitting: Medical

## 2019-09-08 ENCOUNTER — Ambulatory Visit (INDEPENDENT_AMBULATORY_CARE_PROVIDER_SITE_OTHER): Payer: Medicare Other | Admitting: Medical

## 2019-09-08 ENCOUNTER — Other Ambulatory Visit: Payer: Self-pay

## 2019-09-08 ENCOUNTER — Encounter: Payer: Self-pay | Admitting: Medical

## 2019-09-08 VITALS — BP 137/70 | HR 95 | Resp 16 | Ht 62.0 in | Wt 120.0 lb

## 2019-09-08 DIAGNOSIS — M545 Low back pain, unspecified: Secondary | ICD-10-CM

## 2019-09-08 DIAGNOSIS — M25552 Pain in left hip: Secondary | ICD-10-CM | POA: Diagnosis not present

## 2019-09-08 DIAGNOSIS — R0600 Dyspnea, unspecified: Secondary | ICD-10-CM | POA: Diagnosis not present

## 2019-09-08 MED ORDER — METHYLPREDNISOLONE 4 MG PO TABS
ORAL_TABLET | ORAL | 0 refills | Status: DC
Start: 2019-09-08 — End: 2020-02-26

## 2019-09-08 NOTE — Patient Instructions (Addendum)
For back pain, left hip pain and left lower extremity pain, I think brief tapered dose medrol would be helpful for your areas of pain. Hopefully this will help during the interim until you can get repeat injection.  For recent shortness of breath, history of smoking and probable copd component will see how you respond to taper medrol. Will see what pulmonologist thinks and you may get pft.    Check bp at home. Bp relatively well controlled.  Follow up 3 weeks or as needed.

## 2019-09-08 NOTE — Progress Notes (Signed)
Subjective:    Patient ID: Holly Briggs, female    DOB: [birth year], 85 y.o.   MRN: [id number]  HPI   Pt in for follow up.  Pt has left hip pain lower back with pain toward left leg region lateral aspect. Pt was given topical pennsaid. Pt states topical treatment did not help. On last visit patient has did not get steroid injection.   Pt states 06/18/2019 injection did help. It gave her relief almost up to 3 months in past. Presently pain has returned.  Pt upset that she did not get injection. She is aware that often wait 3 months or more between injections.    Pt went to vascular MD at [facility]. She was happy with new vascular MD. That MD recommended magnesium and co-q-10.    Pt has hx of smoking. Yesterday sob briefly when was outside exposed to high humidity. I have referred to pulmonologist.   Review of Systems  Constitutional: Negative for chills, fatigue and fever.  Respiratory: Negative for cough, chest tightness, shortness of breath and wheezing.   Cardiovascular: Negative for chest pain and palpitations.  Gastrointestinal: Negative for abdominal pain.  Musculoskeletal: Positive for back pain.       Left hip area pain  Skin: Negative for rash.  Neurological: Negative for dizziness, weakness, numbness and headaches.  Hematological: Negative for adenopathy. Does not bruise/bleed easily.     Past Medical History:  Diagnosis Date  . Acute bilateral low back pain without sciatica 04/22/2019  . Arthritis   . Atypical chest pain 05/13/2019  . Cancer (HCC)    Basal cell carcinoma  . Cancer (HCC)    Squamous cell carcinoma  . COPD (chronic obstructive pulmonary disease) ([place])   . Coronary artery disease   . History of smoking 05/13/2019  . Hyperlipidemia   . Lipid disorder   . Melanoma ([place])   . Numbness and tingling of right leg    Mild residual  . Onychomadesis of toenail 04/22/2019  . Osteoporosis   . Peripheral arterial disease ([place])   . Peripheral  vascular disease ([place]) 04/22/2019  . PONV (postoperative nausea and vomiting)   . Rhinitis   . Shortness of breath   . 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  . Trochanteric bursitis of left hip 03/26/2019  . Wheezing      Social History   Socioeconomic History  . Marital status: Divorced    Spouse name: Not on file  . Number of children: Not on file  . Years of education: Not on file  . Highest education level: Not on file  Occupational History  . Not on file  Tobacco Use  . Smoking status: Former Smoker    Packs/day: 1.00    Years: 50.00    Pack years: 50.00    Types: Cigarettes    Quit date: 03/17/2007    Years since quitting: 12.4  . Smokeless tobacco: Never Used  Vaping Use  . Vaping Use: Never used  Substance and Sexual Activity  . Alcohol use: Never  . Drug use: Never  . Sexual activity: Not Currently    Birth control/protection: None  Other Topics Concern  . Not on file  Social History Narrative  . Not on file   Social Determinants of Health   Financial Resource Strain: Low Risk   . Difficulty of Paying Living Expenses: Not hard at all  Food Insecurity: No Food Insecurity  . Worried About Charity fundraiser in  the Last Year: Never true  . Ran Out of Food in the Last Year: Never true  Transportation Needs: No Transportation Needs  . Lack of Transportation (Medical): No  . Lack of Transportation (Non-Medical): No  Physical Activity:   . Days of Exercise per Week:   . Minutes of Exercise per Session:   Stress:   . Feeling of Stress :   Social Connections:   . Frequency of Communication with Friends and Family:   . Frequency of Social Gatherings with Friends and Family:   . Attends Religious Services:   . Active Member of Clubs or Organizations:   . Attends Archivist Meetings:   Marland Kitchen Marital Status:   Intimate Partner Violence:   . Fear of Current or Ex-Partner:   . Emotionally Abused:   Marland Kitchen Physically Abused:   . Sexually  Abused:     Past Surgical History:  Procedure Laterality Date  . ANGIOPLASTY    . Arteriography    . COLONOSCOPY    . EYE SURGERY     Laser vision correction  . FASCIOTOMY  03/2017   4 compartment.  . FEMORAL-POPLITEAL BYPASS GRAFT  03/2017   with Prosthetic for ciritcal lim ischemia, 2/2 thrombosis of existing graft.  . Iliofemoral Endarterectomy Right   . SKIN BIOPSY    . SPINE SURGERY     Fusion, Lumbar  . THROMBECTOMY FEMORAL ARTERY  03/2017    Family History  Problem Relation Age of Onset  . Hyperlipidemia Mother   . Pneumonia Father   . Hyperlipidemia Sister   . Hyperlipidemia Brother   . Osteoporosis Sister     Allergies  Allergen Reactions  . Latex   . Penicillins     "Mouth broke out in sores"  . Prednisone Diarrhea  . Tape     Current Outpatient Medications on File Prior to Visit  Medication Sig Dispense Refill  . acetaminophen (TYLENOL) 325 MG tablet Take 325 mg by mouth. May take 3 tablets daily as needed for pain    . apixaban (ELIQUIS) 2.5 MG TABS tablet Take 1 tablet (2.5 mg total) by mouth 2 (two) times daily. 60 tablet 5  . aspirin EC 81 MG tablet Take 81 mg by mouth daily.    Marland Kitchen atorvastatin (LIPITOR) 80 MG tablet Take 1 tablet (80 mg total) by mouth daily. 90 tablet 3  . budesonide-formoterol (SYMBICORT) 80-4.5 MCG/ACT inhaler Inhale 2 puffs into the lungs 2 (two) times daily. 1 Inhaler 3  . ezetimibe (ZETIA) 10 MG tablet Take 1 tablet (10 mg total) by mouth daily. 90 tablet 3  . Vitamin D, Ergocalciferol, (DRISDOL) 1.25 MG (50000 UNIT) CAPS capsule Take 1 capsule by mouth once a week 8 capsule 0   No current facility-administered medications on file prior to visit.    BP (!) 149/72 (BP Location: Left Arm, Patient Position: Sitting, Cuff Size: Normal)   Pulse 95   Resp 16   Ht 5\' 2"  (1.575 m)   Wt 120 lb (54.4 kg)   SpO2 98%   BMI 21.95 kg/m       Objective:   Physical Exam  General Mental Status- Alert. General Appearance- Not in  acute distress.   Skin General: Color- Normal Color. Moisture- Normal Moisture.  Neck Carotid Arteries- Normal color. Moisture- Normal Moisture. No carotid bruits. No JVD.  Chest and Lung Exam Auscultation: Breath Sounds:-Normal.  Cardiovascular Auscultation:Rythm- Regular. Murmurs & Other Heart Sounds:Auscultation of the heart reveals- No Murmurs.  Abdomen Inspection:-Inspeection Normal.  Palpation/Percussion:Note:No mass. Palpation and Percussion of the abdomen reveal- Non Tender, Non Distended + BS, no rebound or guarding.   Neurologic Cranial Nerve exam:- CN III-XII intact(No nystagmus), symmetric smile. Strength:- 5/5 equal and symmetric strength both upper and lower extremities.      Assessment & Plan:  For back pain, left hip pain and left lower extremity pain, I think brief tapered dose medrol would be helpful for your areas of pain. Hopefully this will help during the interim until you can get repeat injection.  For recent shortness of breath, history of smoking and probable copd component will see how you respond to taper medrol. Will see what pulmonologist thinks and you may get pft.    Check bp at home. Bp relatively well controlled.  Follow up 3 weeks or as needed.  Mackie Pai, PA-C   Time spent with patient today was 35  minutes which consisted of chart reveiew, discussing diagnoses, work up, treatment, answering questions  and documentation.

## 2019-09-09 ENCOUNTER — Other Ambulatory Visit (HOSPITAL_BASED_OUTPATIENT_CLINIC_OR_DEPARTMENT_OTHER): Payer: Medicare Other

## 2019-09-17 ENCOUNTER — Other Ambulatory Visit: Payer: Self-pay

## 2019-09-17 ENCOUNTER — Ambulatory Visit (INDEPENDENT_AMBULATORY_CARE_PROVIDER_SITE_OTHER): Payer: Medicare Other

## 2019-09-17 DIAGNOSIS — E538 Deficiency of other specified B group vitamins: Secondary | ICD-10-CM | POA: Diagnosis not present

## 2019-09-17 MED ORDER — CYANOCOBALAMIN 1000 MCG/ML IJ SOLN
1000.0000 ug | Freq: Once | INTRAMUSCULAR | Status: AC
  Administered 2019-09-17: 1000 ug via INTRAMUSCULAR

## 2019-09-17 NOTE — Progress Notes (Addendum)
Pt here for monthly B12 injection per PCP order.   B12 1072mcg given IM, and pt tolerated injection well.  Next B12 injection scheduled for next month.  [place] and agree with administration of b12.  Mackie Pai, PA-C

## 2019-09-29 ENCOUNTER — Ambulatory Visit (INDEPENDENT_AMBULATORY_CARE_PROVIDER_SITE_OTHER): Payer: Medicare Other | Admitting: Medical

## 2019-09-29 ENCOUNTER — Other Ambulatory Visit: Payer: Self-pay

## 2019-09-29 VITALS — BP 132/70 | HR 103 | Resp 18 | Ht 62.0 in | Wt 121.0 lb

## 2019-09-29 DIAGNOSIS — E785 Hyperlipidemia, unspecified: Secondary | ICD-10-CM | POA: Diagnosis not present

## 2019-09-29 DIAGNOSIS — Z86718 Personal history of other venous thrombosis and embolism: Secondary | ICD-10-CM

## 2019-09-29 DIAGNOSIS — R0600 Dyspnea, unspecified: Secondary | ICD-10-CM

## 2019-09-29 DIAGNOSIS — M79661 Pain in right lower leg: Secondary | ICD-10-CM | POA: Diagnosis not present

## 2019-09-29 DIAGNOSIS — R7989 Other specified abnormal findings of blood chemistry: Secondary | ICD-10-CM

## 2019-09-29 DIAGNOSIS — E538 Deficiency of other specified B group vitamins: Secondary | ICD-10-CM

## 2019-09-29 NOTE — Patient Instructions (Signed)
For hx of DVT, continue Eliquis.  Very unlikely if you take Eliquis daily that DVT could form.  For your recent transient brief right calf pain would asked that you let us know if you have any recurrent pain or if any other swelling.  In that event could get a D-dimer.  If D-dimer were to be elevated under the scenario then that would require ultrasound.  You expressed that your insurance company will not allow more than 2 ultrasounds in 1 year.  You mention that you have already had 2 done this year and therefore do not want to get one done today.  I will proceed with caution and please update [place] if signs and symptoms change/worsen.  You think that Zetia caused your transient type pain.  Pain stopped when Zetia was discontinued.  You can stay off Zetia but continue atorvastatin.  Repeat lipid panel in 2 months.  For hyperlipidemia, continue atorvastatin  For low B12, use over-the-counter B12 2500 mcg dose daily.  For history of dyspnea on exertion follow through with pulmonology appointment for PFTs.  Follow-up in approximately 2 months or as needed.

## 2019-09-29 NOTE — Progress Notes (Signed)
   Subjective:    Patient ID: Holly Briggs, female    DOB: 1934-04-11, 85 y.o.   MRN: [id number]  HPI  Pt in for follow up.  Pt has questions. Pt started zetia. She associated this with rt calf pain. Pt has hx of hyperlipidemia. Cardiologist she saw at [facility]. When she stopped zetia symptoms resolved. Pt wants to be off of zetia. Pt no longer smoking and 3 months ago lipitor controlled her lipids.  Pt has hx of dvt. She is on eliquis. Expresses concern for recurrence. She has not skipped any doses. Pt explains her insurance will not allow her to get other [place].   Pt will be seeing pulmonologist for dyspnea on exertion. Not reporting any sob today.   Hx of low b12.  Pt states she is choosing not to return to sports med. Presently. Saw them for low back pain. Pt is doing some exercises she saw in [employer] and she feels like this is helping.  Review of Systems  Constitutional: Negative for chills, fatigue and fever.  Respiratory: Negative for chest tightness and shortness of breath.   Cardiovascular: Negative for chest pain and palpitations.  Gastrointestinal: Negative for abdominal pain, diarrhea and nausea.  Musculoskeletal:       See hpi.  Skin: Negative for rash.  Neurological: Negative for dizziness, weakness and headaches.  Hematological: Negative for adenopathy. Does not bruise/bleed easily.  Psychiatric/Behavioral: Negative for behavioral problems and sleep disturbance. The patient is not nervous/anxious.        Objective:   Physical Exam  General- No acute distress. Pleasant patient. Neck- Full range of motion, no jvd Lungs- Clear, even and unlabored. Heart- regular rate and rhythm. Neurologic- CNII- XII grossly intact.  Rt calf- not swollen, not tender. Negative homans signs.      Assessment & Plan:  For hx of DVT, continue Eliquis.  Very unlikely if you take Eliquis daily that DVT could form.  For your recent transient brief right calf pain would  asked that you let us know if you have any recurrent pain or if any other swelling.  In that event could get a D-dimer.  If D-dimer were to be elevated under the scenario then that would require ultrasound.  You expressed that your insurance company will not allow more than 2 ultrasounds in 1 year.  You mention that you have already had 2 done this year and therefore do not want to get one done today.  I will proceed with caution and please update [place] if signs and symptoms change/worsen.  You think that Zetia caused your transient type pain.  Pain stopped when Zetia was discontinued.  You can stay off Zetia but continue atorvastatin.  Repeat lipid panel in 2 months.  For hyperlipidemia, continue atorvastatin  For low B12, use over-the-counter B12 2500 mcg dose daily.  For history of dyspnea on exertion follow through with pulmonology appointment for PFTs.  Follow-up in approximately 2 months or as needed.  Time spent with patient today was 40  minutes which consisted of chart review, discussing diagnoses, answering questions, treatment discussion, answering questions  and documentation.

## 2019-10-08 ENCOUNTER — Ambulatory Visit: Payer: Medicare Other | Admitting: Family Medicine

## 2019-10-14 DIAGNOSIS — R400 Somnolence: Secondary | ICD-10-CM | POA: Insufficient documentation

## 2019-10-15 ENCOUNTER — Ambulatory Visit: Payer: Medicare Other | Admitting: Family Medicine

## 2019-10-20 ENCOUNTER — Ambulatory Visit: Payer: Medicare Other

## 2019-12-03 ENCOUNTER — Ambulatory Visit: Payer: Medicare Other | Admitting: Medical

## 2019-12-04 ENCOUNTER — Ambulatory Visit (INDEPENDENT_AMBULATORY_CARE_PROVIDER_SITE_OTHER): Payer: Medicare Other | Admitting: Medical

## 2019-12-04 ENCOUNTER — Encounter: Payer: Self-pay | Admitting: Medical

## 2019-12-04 ENCOUNTER — Other Ambulatory Visit: Payer: Self-pay

## 2019-12-04 VITALS — BP 130/70 | HR 98 | Temp 98.5°F | Resp 18 | Ht 62.0 in | Wt 120.6 lb

## 2019-12-04 DIAGNOSIS — M79671 Pain in right foot: Secondary | ICD-10-CM

## 2019-12-04 DIAGNOSIS — Z23 Encounter for immunization: Secondary | ICD-10-CM | POA: Diagnosis not present

## 2019-12-04 DIAGNOSIS — I739 Peripheral vascular disease, unspecified: Secondary | ICD-10-CM

## 2019-12-04 DIAGNOSIS — E538 Deficiency of other specified B group vitamins: Secondary | ICD-10-CM

## 2019-12-04 DIAGNOSIS — Z86718 Personal history of other venous thrombosis and embolism: Secondary | ICD-10-CM | POA: Diagnosis not present

## 2019-12-04 DIAGNOSIS — E785 Hyperlipidemia, unspecified: Secondary | ICD-10-CM

## 2019-12-04 MED ORDER — BUDESONIDE-FORMOTEROL FUMARATE 80-4.5 MCG/ACT IN AERO: 2.0000 | INHALATION_SPRAY | Freq: Two times a day (BID) | RESPIRATORY_TRACT | 3 refills | Status: DC

## 2019-12-04 MED ORDER — LEVOCETIRIZINE DIHYDROCHLORIDE 5 MG PO TABS: 5.0000 mg | ORAL_TABLET | Freq: Every evening | ORAL | 3 refills | Status: DC

## 2019-12-04 MED ORDER — APIXABAN 2.5 MG PO TABS: 2.5000 mg | ORAL_TABLET | Freq: Two times a day (BID) | ORAL | 5 refills | Status: DC

## 2019-12-04 MED ORDER — LEVOCETIRIZINE DIHYDROCHLORIDE 5 MG PO TABS
5.0000 mg | ORAL_TABLET | Freq: Every evening | ORAL | 3 refills | Status: DC
Start: 2019-12-04 — End: 2019-12-04

## 2019-12-04 MED ORDER — BUDESONIDE-FORMOTEROL FUMARATE 80-4.5 MCG/ACT IN AERO
2.0000 | INHALATION_SPRAY | Freq: Two times a day (BID) | RESPIRATORY_TRACT | 3 refills | Status: DC
Start: 2019-12-04 — End: 2019-12-04

## 2019-12-04 NOTE — Patient Instructions (Signed)
For hx of dvt, rx'd refill of eliquis. Form for rx assistance pending.  For high cholesterol continue current meds. Last check of lipids controlled.  For low b12 get level today.  For hx of dyspnea try the symbicort. Work up recommended by specialist declined.   For foot pain placed referral to podiatrist.  For nasal congestion and sneezing rx xyzal. In past nasal steroid did not work.  Flu vaccine today.  Follow up 3 months or as needed

## 2019-12-04 NOTE — Addendum Note (Signed)
Addended by: Kelle Darting A on: 12/04/2019 09:19 AM   Modules accepted: Orders

## 2019-12-04 NOTE — Progress Notes (Signed)
Subjective:    Patient ID: Holly Briggs, female    DOB: 14-Sep-1934, 85 y.o.   MRN: [id number]  HPI  Pr needs refill of eliquis.Hx of DVT. Pt has seen Dr. Donnetta Hutching. She states told to make follow up. Pt states she told to make follow up but could not.So pt did see vascular surgeon at [place] states about to enter donut whole. She request form filled out for [facility]. I started filling out form today. Need to put superivising MD information on form then contact pt.   Pt has seen Cardiologist at [place] states just did ekg.   Pt did see pulmonologist. She states they just talked to her. She states they wanted to do lung testing. She declined. She expresses frustration about number of test all her new providers wanted to do.  Pt has low b12. Offered injection but she decided to do b12 oral supplementation.   Rt foot pain. She states some callouses. She wants referral to podiatrist.  Pt states she won't get vaccinated against covid.   Review of Systems  Constitutional: Negative for chills, fatigue and fever.  Respiratory: Negative for chest tightness, shortness of breath and wheezing.   Cardiovascular: Negative for palpitations.  Gastrointestinal: Negative for abdominal pain.  Musculoskeletal: Negative for back pain.  Skin: Negative for rash.  Neurological: Negative for dizziness and light-headedness.  Hematological: Negative for adenopathy. Does not bruise/bleed easily.  Psychiatric/Behavioral: Negative for behavioral problems and confusion.    Past Medical History:  Diagnosis Date  . Acute bilateral low back pain without sciatica 04/22/2019  . Arthritis   . Atypical chest pain 05/13/2019  . Cancer (HCC)    Basal cell carcinoma  . Cancer (HCC)    Squamous cell carcinoma  . COPD (chronic obstructive pulmonary disease) ([place])   . Coronary artery disease   . History of smoking 05/13/2019  . Hyperlipidemia   . Lipid disorder   . Melanoma ([place])   .  Numbness and tingling of right leg    Mild residual  . Onychomadesis of toenail 04/22/2019  . Osteoporosis   . Peripheral arterial disease ([place])   . Peripheral vascular disease ([place]) 04/22/2019  . PONV (postoperative nausea and vomiting)   . Rhinitis   . Shortness of breath   . 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  . Trochanteric bursitis of left hip 03/26/2019  . Wheezing      Social History   Socioeconomic History  . Marital status: Divorced    Spouse name: Not on file  . Number of children: Not on file  . Years of education: Not on file  . Highest education level: Not on file  Occupational History  . Not on file  Tobacco Use  . Smoking status: Former Smoker    Packs/day: 1.00    Years: 50.00    Pack years: 50.00    Types: Cigarettes    Quit date: 03/17/2007    Years since quitting: 12.7  . Smokeless tobacco: Never Used  Vaping Use  . Vaping Use: Never used  Substance and Sexual Activity  . Alcohol use: Never  . Drug use: Never  . Sexual activity: Not Currently    Birth control/protection: None  Other Topics Concern  . Not on file  Social History Narrative  . Not on file   Social Determinants of Health   Financial Resource Strain: Low Risk   . Difficulty of Paying Living Expenses: Not hard at all  Food Insecurity: No Food Insecurity  . Worried About Charity fundraiser in the Last Year: Never true  . Ran Out of Food in the Last Year: Never true  Transportation Needs: No Transportation Needs  . Lack of Transportation (Medical): No  . Lack of Transportation (Non-Medical): No  Physical Activity:   . Days of Exercise per Week: Not on file  . Minutes of Exercise per Session: Not on file  Stress:   . Feeling of Stress : Not on file  Social Connections:   . Frequency of Communication with Friends and Family: Not on file  . Frequency of Social Gatherings with Friends and Family: Not on file  . Attends Religious Services: Not on file  . Active  Member of Clubs or Organizations: Not on file  . Attends Archivist Meetings: Not on file  . Marital Status: Not on file  Intimate Partner Violence:   . Fear of Current or Ex-Partner: Not on file  . Emotionally Abused: Not on file  . Physically Abused: Not on file  . Sexually Abused: Not on file    Past Surgical History:  Procedure Laterality Date  . ANGIOPLASTY    . Arteriography    . COLONOSCOPY    . EYE SURGERY     Laser vision correction  . FASCIOTOMY  03/2017   4 compartment.  . FEMORAL-POPLITEAL BYPASS GRAFT  03/2017   with Prosthetic for ciritcal lim ischemia, 2/2 thrombosis of existing graft.  . Iliofemoral Endarterectomy Right   . SKIN BIOPSY    . SPINE SURGERY     Fusion, Lumbar  . THROMBECTOMY FEMORAL ARTERY  03/2017    Family History  Problem Relation Age of Onset  . Hyperlipidemia Mother   . Pneumonia Father   . Hyperlipidemia Sister   . Hyperlipidemia Brother   . Osteoporosis Sister     Allergies  Allergen Reactions  . Latex   . Penicillins     "Mouth broke out in sores"  . Prednisone Diarrhea  . Tape     Current Outpatient Medications on File Prior to Visit  Medication Sig Dispense Refill  . acetaminophen (TYLENOL) 325 MG tablet Take 325 mg by mouth. May take 3 tablets daily as needed for pain    . aspirin EC 81 MG tablet Take 81 mg by mouth daily.    Marland Kitchen atorvastatin (LIPITOR) 80 MG tablet Take 1 tablet (80 mg total) by mouth daily. 90 tablet 3  . budesonide-formoterol (SYMBICORT) 80-4.5 MCG/ACT inhaler Inhale 2 puffs into the lungs 2 (two) times daily. 1 Inhaler 3  . ezetimibe (ZETIA) 10 MG tablet Take 1 tablet (10 mg total) by mouth daily. 90 tablet 3  . methylPREDNISolone (MEDROL) 4 MG tablet 4 tab po day 1, 3 tab po day 2, 2 tab po day 3, 1 tab po day 4 10 tablet 0  . Vitamin D, Ergocalciferol, (DRISDOL) 1.25 MG (50000 UNIT) CAPS capsule Take 1 capsule by mouth once a week 8 capsule 0   No current facility-administered medications on  file prior to visit.    BP (!) 150/65   Pulse 98   Temp 98.5 F (36.9 C) (Oral)   Resp 18   Ht 5\' 2"  (1.575 m)   Wt 120 lb 9.6 oz (54.7 kg)   SpO2 99%   BMI 22.06 kg/m       Objective:   Physical Exam  General Mental Status- Alert. General Appearance- Not in acute distress.   Skin General:  Color- Normal Color. Moisture- Normal Moisture.  Neck Carotid Arteries- Normal color. Moisture- Normal Moisture. No carotid bruits. No JVD.  Chest and Lung Exam Auscultation: Breath Sounds:-Normal.  Cardiovascular Auscultation:Rythm- Regular. Murmurs & Other Heart Sounds:Auscultation of the heart reveals- No Murmurs.  Abdomen Inspection:-Inspeection Normal. Palpation/Percussion:Note:No mass. Palpation and Percussion of the abdomen reveal- Non Tender, Non Distended + BS, no rebound or guarding.    Neurologic Cranial Nerve exam:- CN III-XII intact(No nystagmus), symmetric smile. Strength:- 5/5 equal and symmetric strength both upper and lower extremities.  Rt foot- slight faint brusing appearance bottom of foot base of 3rd and 4th toe. Callous in that area as well.    Assessment & Plan:  For hx of dvt, rx'd refill of eliquis. Form for rx assistance pending.  For high cholesterol continue current meds. Last check of lipids controlled.  For low b12 get level today.  For hx of dyspnea try the symbicort. Work up recommended by specialist declined.   For foot pain placed referral to podiatrist.  For nasal congestion and sneezing rx xyzal. In past nasal steroid did not work.  Flu vaccine today.  Follow up 3 months or as needed  Mackie Pai, PA-C   Time spent with patient today was 35  minutes which consisted of chart review, discussing diagnosis, work up, treatment and documentation.

## 2019-12-04 NOTE — Addendum Note (Signed)
Addended by: Jeronimo Greaves on: 12/04/2019 09:32 AM   Modules accepted: Orders

## 2019-12-05 LAB — VITAMIN B12: Vitamin B-12: 362 pg/mL (ref 200–1100)

## 2019-12-08 ENCOUNTER — Telehealth: Payer: Self-pay | Admitting: Medical

## 2019-12-08 NOTE — Telephone Encounter (Signed)
Patient calling in reference to medication assistance form left at office last week. Patient is checking the status of the form. Patient would like a call once form is completed.

## 2019-12-09 NOTE — Telephone Encounter (Signed)
Pt called to f/up on status of completion of medication assistance form.  Please let pt know when form is completed so she can come pick it up as soon as possible.  Ph # [phone].

## 2019-12-10 NOTE — Telephone Encounter (Signed)
Will you call pt and have her come pick up form. If you would make copy and place on [place].   Also will you double check Dr. Charlett Blake npi number

## 2019-12-10 NOTE — Telephone Encounter (Signed)
Thanks,

## 2019-12-10 NOTE — Telephone Encounter (Signed)
I have double checked Dr. Frederik Pear NPI number and it is correct on the form.  I am calling the pt to let hr know form is ready for pick up.  I will make a copy and put it on Holly Briggs's desk.

## 2019-12-23 ENCOUNTER — Ambulatory Visit (INDEPENDENT_AMBULATORY_CARE_PROVIDER_SITE_OTHER): Payer: Medicare Other | Admitting: Podiatry

## 2019-12-23 ENCOUNTER — Other Ambulatory Visit: Payer: Self-pay

## 2019-12-23 DIAGNOSIS — B351 Tinea unguium: Secondary | ICD-10-CM

## 2019-12-23 DIAGNOSIS — M79675 Pain in left toe(s): Secondary | ICD-10-CM

## 2019-12-23 DIAGNOSIS — Q828 Other specified congenital malformations of skin: Secondary | ICD-10-CM | POA: Diagnosis not present

## 2019-12-23 DIAGNOSIS — M79674 Pain in right toe(s): Secondary | ICD-10-CM

## 2019-12-24 ENCOUNTER — Encounter: Payer: Self-pay | Admitting: Podiatry

## 2019-12-24 NOTE — Progress Notes (Signed)
  Subjective:  Patient ID: Holly Briggs, female    DOB: February 21, 1935,  MRN: [id number]  Chief Complaint  Patient presents with  . Callouses    painful callus on foot   85 y.o. female returns for the above complaint.  Patient presents with thickened elongated dystrophic toenails x10.  Patient would like to have them debrided down as she is not able to do it herself.  She states that they are gone long and she is not unable to bend away.  She has secondary complaint of right submetatarsal two hyperkeratotic lesion that has been painful to walk on.  She states that it hurts right in the spot.  She would like to have it debrided down if possible.  She has not seen anyone else prior to seeing me for both of these services.  She denies any other acute complaints.  Objective:  There were no vitals filed for this visit. Podiatric Exam: Vascular: dorsalis pedis and posterior tibial pulses are palpable bilateral. Capillary return is immediate. Temperature gradient is WNL. Skin turgor WNL  Sensorium: Normal Semmes Weinstein monofilament test. Normal tactile sensation bilaterally. Nail Exam: Pt has thick disfigured discolored nails with subungual debris noted bilateral entire nail hallux through fifth toenails.  Pain on palpation to the nails. Ulcer Exam: There is no evidence of ulcer or pre-ulcerative changes or infection. Orthopedic Exam: Muscle tone and strength are WNL. No limitations in general ROM. No crepitus or effusions noted. HAV  B/L.  Hammer toes 2-5  B/L. Skin:  No infection or ulcers.  Hyperkeratotic lesion/porokeratosis noted to submetatarsal two.  Pain on palpation to the lesion.  Upon debridement no pinpoint bleeding or ulceration noted.    Assessment & Plan:   1. Pain due to onychomycosis of toenails of both feet   2. Porokeratosis     Patient was evaluated and treated and all questions answered.  Right submetatarsal two porokeratosis -I explained to the patient the  etiology of porokeratosis and various treatment options were discussed.  I believe patient will benefit from aggressive debridement of the lesion.  Using chisel blade to handle the lesion was debrided down to healthy striated tissue.  No pinpoint bleeding noted.  Pain relief was noted after debridement. -Metatarsal pad was also dispensed  Onychomycosis with pain  -Nails palliatively debrided as below. -Educated on self-care  Procedure: Nail Debridement Rationale: pain  Type of Debridement: manual, sharp debridement. Instrumentation: Nail nipper, rotary burr. Number of Nails: 10  Procedures and Treatment: Consent by patient was obtained for treatment procedures. The patient understood the discussion of treatment and procedures well. All questions were answered thoroughly reviewed. Debridement of mycotic and hypertrophic toenails, 1 through 5 bilateral and clearing of subungual debris. No ulceration, no infection noted.  Return Visit-Office Procedure: Patient instructed to return to the office for a follow up visit 3 months for continued evaluation and treatment.  Boneta Lucks, DPM    No follow-ups on file.

## 2019-12-31 ENCOUNTER — Telehealth: Payer: Self-pay | Admitting: Medical

## 2019-12-31 NOTE — Telephone Encounter (Signed)
Caller: [employer]  Medication : Eliquis   Per the foundation they have received an application for medication assistance for patient however,  The application is incomplete, they are requesting to  have the name of PA and his NPI on the application in order to be process.

## 2020-01-01 NOTE — Telephone Encounter (Signed)
No forms faxed over from the office

## 2020-01-14 ENCOUNTER — Ambulatory Visit: Payer: Medicare Other | Admitting: Cardiology

## 2020-02-06 ENCOUNTER — Other Ambulatory Visit: Payer: Self-pay

## 2020-02-06 ENCOUNTER — Emergency Department (HOSPITAL_BASED_OUTPATIENT_CLINIC_OR_DEPARTMENT_OTHER)
Admission: EM | Admit: 2020-02-06 | Discharge: 2020-02-06 | Disposition: A | Discharge status: Home / Self Care | Payer: Medicare Other | Attending: Emergency Medicine | Admitting: Emergency Medicine

## 2020-02-06 ENCOUNTER — Encounter (HOSPITAL_BASED_OUTPATIENT_CLINIC_OR_DEPARTMENT_OTHER): Payer: Self-pay | Admitting: Emergency Medicine

## 2020-02-06 DIAGNOSIS — W08XXXA Fall from other furniture, initial encounter: Secondary | ICD-10-CM | POA: Insufficient documentation

## 2020-02-06 DIAGNOSIS — Z87891 Personal history of nicotine dependence: Secondary | ICD-10-CM | POA: Insufficient documentation

## 2020-02-06 DIAGNOSIS — Z7982 Long term (current) use of aspirin: Secondary | ICD-10-CM | POA: Insufficient documentation

## 2020-02-06 DIAGNOSIS — J449 Chronic obstructive pulmonary disease, unspecified: Secondary | ICD-10-CM | POA: Diagnosis not present

## 2020-02-06 DIAGNOSIS — Z23 Encounter for immunization: Secondary | ICD-10-CM | POA: Insufficient documentation

## 2020-02-06 DIAGNOSIS — Z91040 Latex allergy status: Secondary | ICD-10-CM | POA: Insufficient documentation

## 2020-02-06 DIAGNOSIS — S81812A Laceration without foreign body, left lower leg, initial encounter: Secondary | ICD-10-CM | POA: Insufficient documentation

## 2020-02-06 DIAGNOSIS — S8990XA Unspecified injury of unspecified lower leg, initial encounter: Secondary | ICD-10-CM | POA: Diagnosis present

## 2020-02-06 DIAGNOSIS — W19XXXA Unspecified fall, initial encounter: Secondary | ICD-10-CM

## 2020-02-06 DIAGNOSIS — I2510 Atherosclerotic heart disease of native coronary artery without angina pectoris: Secondary | ICD-10-CM | POA: Insufficient documentation

## 2020-02-06 MED ORDER — LIDOCAINE HCL (PF) 1 % IJ SOLN
INTRAMUSCULAR | Status: AC
  Administered 2020-02-06: 5 mL
  Filled 2020-02-06: qty 5

## 2020-02-06 MED ORDER — TETANUS-DIPHTH-ACELL PERTUSSIS 5-2.5-18.5 LF-MCG/0.5 IM SUSY
0.5000 mL | PREFILLED_SYRINGE | Freq: Once | INTRAMUSCULAR | Status: AC
  Administered 2020-02-06: 0.5 mL via INTRAMUSCULAR
  Filled 2020-02-06: qty 0.5

## 2020-02-06 MED ORDER — LIDOCAINE-EPINEPHRINE-TETRACAINE (LET) TOPICAL GEL
3.0000 mL | Freq: Once | TOPICAL | Status: AC
  Administered 2020-02-06: 3 mL via TOPICAL
  Filled 2020-02-06: qty 3

## 2020-02-06 NOTE — ED Notes (Signed)
Suture cart and suture tray at bedside

## 2020-02-06 NOTE — ED Notes (Signed)
Tdap administered.

## 2020-02-06 NOTE — ED Notes (Signed)
Pt discharged to home. Discharge instructions have been discussed with patient and/or family members. Pt verbally acknowledges understanding d/c instructions, and endorses comprehension to checkout at registration before leaving.  °

## 2020-02-06 NOTE — ED Notes (Signed)
ED Provider at bedside. 

## 2020-02-06 NOTE — ED Notes (Signed)
Derma clips at bedside

## 2020-02-06 NOTE — ED Notes (Signed)
ED Provider at bedside for suture repair

## 2020-02-06 NOTE — ED Notes (Signed)
dermaclips not used, returned to pyxis

## 2020-02-06 NOTE — ED Triage Notes (Signed)
Pt fell off of a stool today. Skin tear noted to L shin area. She takes Eliquis. Bandage in place and bleeding controlled. She did not hit her head.

## 2020-02-06 NOTE — ED Provider Notes (Signed)
[facility] EMERGENCY DEPARTMENT Provider Note   CSN: [id number] Arrival date & time: 02/06/20  1302     History Chief Complaint  Patient presents with  . Fall    Holly Briggs is a 85 y.o. female.  The history is provided by the patient and a relative.  Fall   Holly Briggs is a 85 y.o. female who presents to the Emergency Department complaining of fall. She presents the emergency department accompanied by her niece for evaluation of injuries following a fall today. She was standing on a stool when she planted her right foot on the ground and the stool fell in her left shin scraped the front of the stool. This happened around 1030 this morning. She does take eliquis for history of blood clots. She did not fall or hit her head. She complains of pain to the front of her left leg. She is able to ambulate without difficulty. She does not have any pain in the bone itself.    Past Medical History:  Diagnosis Date  . Acute bilateral low back pain without sciatica 04/22/2019  . Arthritis   . Atypical chest pain 05/13/2019  . Cancer (HCC)    Basal cell carcinoma  . Cancer (HCC)    Squamous cell carcinoma  . COPD (chronic obstructive pulmonary disease) ([place])   . Coronary artery disease   . History of smoking 05/13/2019  . Hyperlipidemia   . Lipid disorder   . Melanoma ([place])   . Numbness and tingling of right leg    Mild residual  . Onychomadesis of toenail 04/22/2019  . Osteoporosis   . Peripheral arterial disease ([place])   . Peripheral vascular disease ([place]) 04/22/2019  . PONV (postoperative nausea and vomiting)   . Rhinitis   . Shortness of breath   . 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  . Trochanteric bursitis of left hip 03/26/2019  . Wheezing     Patient Active Problem List   Diagnosis Date Noted  . Dyslipidemia 06/11/2019  . Atypical chest pain 05/13/2019  . History of smoking 05/13/2019  . Peripheral vascular disease  ([place]) 04/22/2019  . 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  . Acute bilateral low back pain without sciatica 04/22/2019  . Onychomadesis of toenail 04/22/2019  . Trochanteric bursitis of left hip 03/26/2019    Past Surgical History:  Procedure Laterality Date  . ANGIOPLASTY    . Arteriography    . COLONOSCOPY    . EYE SURGERY     Laser vision correction  . FASCIOTOMY  03/2017   4 compartment.  . FEMORAL-POPLITEAL BYPASS GRAFT  03/2017   with Prosthetic for ciritcal lim ischemia, 2/2 thrombosis of existing graft.  . Iliofemoral Endarterectomy Right   . SKIN BIOPSY    . SPINE SURGERY     Fusion, Lumbar  . THROMBECTOMY FEMORAL ARTERY  03/2017     OB History   No obstetric history on file.     Family History  Problem Relation Age of Onset  . Hyperlipidemia Mother   . Pneumonia Father   . Hyperlipidemia Sister   . Hyperlipidemia Brother   . Osteoporosis Sister     Social History   Tobacco Use  . Smoking status: Former Smoker    Packs/day: 1.00    Years: 50.00    Pack years: 50.00    Types: Cigarettes    Quit date: 03/17/2007    Years since quitting: 12.9  .  Smokeless tobacco: Never Used  Vaping Use  . Vaping Use: Never used  Substance Use Topics  . Alcohol use: Never  . Drug use: Never    Home Medications Prior to Admission medications   Medication Sig Start Date End Date Taking? Authorizing Provider  acetaminophen (TYLENOL) 325 MG tablet Take 325 mg by mouth. May take 3 tablets daily as needed for pain    [provider]  apixaban (ELIQUIS) 2.5 MG TABS tablet Take 1 tablet (2.5 mg total) by mouth 2 (two) times daily. 12/04/19   Saguier, Percell Miller, PA-C  aspirin EC 81 MG tablet Take 81 mg by mouth daily.    [provider]  atorvastatin (LIPITOR) 80 MG tablet Take 1 tablet (80 mg total) by mouth daily. 03/17/19   Saguier, Percell Miller, PA-C  budesonide-formoterol (SYMBICORT) 80-4.5 MCG/ACT inhaler Inhale 2 puffs into the lungs 2  (two) times daily. 08/11/19   Saguier, Percell Miller, PA-C  budesonide-formoterol (SYMBICORT) 80-4.5 MCG/ACT inhaler Inhale 2 puffs into the lungs 2 (two) times daily. 12/04/19   Saguier, Percell Miller, PA-C  ezetimibe (ZETIA) 10 MG tablet Take 1 tablet (10 mg total) by mouth daily. 08/11/19   Saguier, Percell Miller, PA-C  levocetirizine (XYZAL) 5 MG tablet Take 1 tablet (5 mg total) by mouth every evening. 12/04/19   Saguier, Percell Miller, PA-C  methylPREDNISolone (MEDROL) 4 MG tablet 4 tab po day 1, 3 tab po day 2, 2 tab po day 3, 1 tab po day 4 09/08/19   Saguier, Percell Miller, PA-C  Vitamin D, Ergocalciferol, (DRISDOL) 1.25 MG (50000 UNIT) CAPS capsule Take 1 capsule by mouth once a week 09/07/19   Saguier, Percell Miller, PA-C    Allergies    Latex, Penicillins, Prednisone, and Tape  Review of Systems   Review of Systems  All other systems reviewed and are negative.   Physical Exam Updated Vital Signs BP (!) 150/81   Pulse 84   Temp 98.6 F (37 C) (Oral)   Resp 20   Ht 5\' 2"  (1.575 m)   Wt 53.5 kg   SpO2 100%   BMI 21.58 kg/m   Physical Exam Vitals and nursing note reviewed.  Constitutional:      Appearance: She is well-developed.  HENT:     Head: Normocephalic and atraumatic.  Cardiovascular:     Rate and Rhythm: Normal rate and regular rhythm.  Pulmonary:     Effort: Pulmonary effort is normal. No respiratory distress.  Musculoskeletal:     Comments: 2+ DP pulses bilaterally. There is a 2 x 5 cm irregular skin tear to the left anterior shin. Flexion extension intact at the ankle. There is no bony tenderness to the leg.  Skin:    General: Skin is warm and dry.  Neurological:     Mental Status: She is alert and oriented to person, place, and time.  Psychiatric:        Behavior: Behavior normal.     ED Results / Procedures / Treatments   Labs (all labs ordered are listed, but only abnormal results are displayed) Labs Reviewed - No data to display  EKG None  Radiology No results  found.  Procedures .Marland KitchenLaceration Repair  Date/Time: 02/06/2020 4:30 PM Performed by: Quintella Reichert, MD Authorized by: Quintella Reichert, MD   Consent:    Consent obtained:  Verbal   Consent given by:  Patient   Risks discussed:  Infection, pain, poor cosmetic result and poor wound healing   Alternatives discussed:  No treatment Anesthesia (see MAR for exact dosages):  Anesthesia method:  Topical application and local infiltration   Topical anesthetic:  LET   Local anesthetic:  Lidocaine 1% w/o epi Laceration details:    Location:  Leg   Wound length (cm): 2x5cm. Repair type:    Repair type:  Complex Exploration:    Hemostasis achieved with:  Direct pressure   Wound exploration: wound explored through full range of motion     Contaminated: no   Treatment:    Area cleansed with:  Saline and Shur-Clens   Amount of cleaning:  Standard   Debridement:  Moderate Skin repair:    Repair method:  Sutures   Suture size:  4-0   Suture material:  Prolene   Suture technique:  Simple interrupted   Number of sutures:  3 Approximation:    Approximation:  Close Post-procedure details:    Dressing:  Antibiotic ointment and non-adherent dressing   Patient tolerance of procedure:  Tolerated well, no immediate complications   (including critical care time)  Medications Ordered in ED Medications  lidocaine-EPINEPHrine-tetracaine (LET) topical gel (3 mLs Topical Given 02/06/20 1522)  Tdap (BOOSTRIX) injection 0.5 mL (0.5 mLs Intramuscular Given 02/06/20 1614)  lidocaine (PF) (XYLOCAINE) 1 % injection (5 mLs  Given by Other 02/06/20 1613)    ED Course  I have reviewed the triage vital signs and the nursing notes.  Pertinent labs & imaging results that were available during my care of the patient were reviewed by me and considered in my medical decision making (see chart for details).    MDM Rules/Calculators/A&P                         patient here for evaluation of wound to left  leg. No clinical evidence of fracture. No evidence of additional injuries on examination. Wound is a complicated skin tear. Repaired per note. Discussed local wound care, outpatient follow-up and return precautions.  Final Clinical Impression(s) / ED Diagnoses Final diagnoses:  Fall, initial encounter  Laceration of left lower leg, initial encounter    Rx / DC Orders ED Discharge Orders    None       Quintella Reichert, MD 02/06/20 [phone]

## 2020-02-18 ENCOUNTER — Encounter: Payer: Self-pay | Admitting: Medical

## 2020-02-18 ENCOUNTER — Ambulatory Visit (INDEPENDENT_AMBULATORY_CARE_PROVIDER_SITE_OTHER): Payer: Medicare Other | Admitting: Medical

## 2020-02-18 ENCOUNTER — Other Ambulatory Visit: Payer: Self-pay

## 2020-02-18 VITALS — BP 120/82 | HR 95 | Temp 97.9°F | Resp 20 | Ht 62.0 in | Wt 121.4 lb

## 2020-02-18 DIAGNOSIS — Z4802 Encounter for removal of sutures: Secondary | ICD-10-CM

## 2020-02-18 DIAGNOSIS — Z86718 Personal history of other venous thrombosis and embolism: Secondary | ICD-10-CM | POA: Diagnosis not present

## 2020-02-18 DIAGNOSIS — R0981 Nasal congestion: Secondary | ICD-10-CM

## 2020-02-18 DIAGNOSIS — I739 Peripheral vascular disease, unspecified: Secondary | ICD-10-CM

## 2020-02-18 DIAGNOSIS — T148XXA Other injury of unspecified body region, initial encounter: Secondary | ICD-10-CM

## 2020-02-18 DIAGNOSIS — R1031 Right lower quadrant pain: Secondary | ICD-10-CM

## 2020-02-18 DIAGNOSIS — L089 Local infection of the skin and subcutaneous tissue, unspecified: Secondary | ICD-10-CM

## 2020-02-18 LAB — COMPREHENSIVE METABOLIC PANEL
ALT: 16 U/L (ref 0–35)
AST: 23 U/L (ref 0–37)
Albumin: 4.1 g/dL (ref 3.5–5.2)
Alkaline Phosphatase: 70 U/L (ref 39–117)
BUN: 23 mg/dL (ref 6–23)
CO2: 30 mEq/L (ref 19–32)
Calcium: 9.3 mg/dL (ref 8.4–10.5)
Chloride: 104 mEq/L (ref 96–112)
Creatinine, Ser: 0.99 mg/dL (ref 0.40–1.20)
GFR: 52.9 mL/min — ABNORMAL LOW (ref >60.00)
Glucose, Bld: 129 mg/dL — ABNORMAL HIGH (ref 70–99)
Potassium: 4.1 mEq/L (ref 3.5–5.1)
Sodium: 141 mEq/L (ref 135–145)
Total Bilirubin: 0.5 mg/dL (ref 0.2–1.2)
Total Protein: 6.4 g/dL (ref 6.0–8.3)

## 2020-02-18 LAB — CBC WITH DIFFERENTIAL/PLATELET
Basophils Absolute: 0.1 10*3/uL (ref 0.0–0.1)
Basophils Relative: 1.1 % (ref 0.0–3.0)
Eosinophils Absolute: 0.3 10*3/uL (ref 0.0–0.7)
Eosinophils Relative: 4.3 % (ref 0.0–5.0)
HCT: 34.9 % — ABNORMAL LOW (ref 36.0–46.0)
Hemoglobin: 11.7 g/dL — ABNORMAL LOW (ref 12.0–15.0)
Lymphocytes Relative: 17.3 % (ref 12.0–46.0)
Lymphs Abs: 1.2 10*3/uL (ref 0.7–4.0)
MCHC: 33.5 g/dL (ref 30.0–36.0)
MCV: 88.1 fl (ref 78.0–100.0)
Monocytes Absolute: 0.4 10*3/uL (ref 0.1–1.0)
Monocytes Relative: 5.9 % (ref 3.0–12.0)
Neutro Abs: 4.9 10*3/uL (ref 1.4–7.7)
Neutrophils Relative %: 71.4 % (ref 43.0–77.0)
Platelets: 253 10*3/uL (ref 150.0–400.0)
RBC: 3.96 Mil/uL (ref 3.87–5.11)
RDW: 14.3 % (ref 11.5–15.5)
WBC: 6.8 10*3/uL (ref 4.0–10.5)

## 2020-02-18 MED ORDER — APIXABAN 2.5 MG PO TABS: 2.5000 mg | ORAL_TABLET | Freq: Two times a day (BID) | ORAL | 5 refills | Status: DC

## 2020-02-18 MED ORDER — SULFAMETHOXAZOLE-TRIMETHOPRIM 800-160 MG PO TABS: 1.0000 | ORAL_TABLET | Freq: Two times a day (BID) | ORAL | 0 refills | Status: DC

## 2020-02-18 NOTE — Patient Instructions (Signed)
Recent right lower quadrant pain over the past 2 weeks.  Pain has been intermittent.  History of remote hernia repair years ago.  Based on location do think would be best to go ahead and order CT abdomen pelvis to evaluate appendix.  Might find incidental hernia though on physical exam I did not see any obvious bulge.  Will need to get prior authorization before test can be done.  We will go ahead and get CBC and metabolic panel today.  Recent suture placement in the emergency department.  Left lower extremity wound looks well-healed.  However does appear to have skin infection.  3 sutures removed without any problems.  Wound healed up well.  Medical assistant to apply 3 Steri-Strips to wound.  I did get wound culture and prescribed Bactrim DS pending wound culture results.  History of DVT and needs refill of Eliquis.  Print prescription given to patient.  History of chronic nasal congestion and possible deviated septum on exam.  Place referral to ENT.  Follow-up in 2 weeks or sooner if needed.

## 2020-02-18 NOTE — Progress Notes (Signed)
Subjective:    Patient ID: Holly Briggs, female    DOB: 11-09-1934, 85 y.o.   MRN: [id number]  HPI  Pt in states she had left leg laceration. She states she was sitting on stool in closet. Stool flipped over and top scraped the tibia.   Accident happened on 02-06-20. She went to the ED.   Pt had wider area of redness around wound on Thanksgiving. Pt states sutures on distal aspect of laceration.   ED description of wound.  Musculoskeletal:     Comments: 2+ DP pulses bilaterally. There is a 2 x 5 cm irregular skin tear to the left anterior shin. Flexion extension intact at the ankle. There is no bony tenderness to the leg.  Skin:    General: Skin is warm and dry.    Pt got tetanus update. Total 3 sutures placed.  Wound does hurt. Dull ache. No fever, no chills or sweats.   Pt told to get sutures removed dec 2 or the 3rd.   Rt inguinal hernia in past. She had surgery years ago. She has recurrent pain in same area. Pain comes and goes. Pt does have appendix. Some decrease appetite. But still eats. No fever.  Pt has chronic nasal congestion. Constant pnd. She request referral to ENT.  Hx of dvt in past. Needs refill of eliquis.       Review of Systems  Constitutional: Negative for chills and fatigue.  Respiratory: Negative for choking, chest tightness, shortness of breath and wheezing.   Cardiovascular: Negative for chest pain and palpitations.  Gastrointestinal: Positive for abdominal pain. Negative for abdominal distention, constipation, diarrhea, nausea and vomiting.  Musculoskeletal: Negative for back pain.       Left lower ext- see hpi.   Skin: Negative for rash.  Neurological: Negative for dizziness, seizures, syncope, weakness and headaches.  Hematological: Negative for adenopathy. Does not bruise/bleed easily.  Psychiatric/Behavioral: Negative for behavioral problems, confusion, sleep disturbance and suicidal ideas. The patient is not  nervous/anxious.    Past Medical History:  Diagnosis Date  . Acute bilateral low back pain without sciatica 04/22/2019  . Arthritis   . Atypical chest pain 05/13/2019  . Cancer (HCC)    Basal cell carcinoma  . Cancer (HCC)    Squamous cell carcinoma  . COPD (chronic obstructive pulmonary disease) ([place])   . Coronary artery disease   . History of smoking 05/13/2019  . Hyperlipidemia   . Lipid disorder   . Melanoma ([place])   . Numbness and tingling of right leg    Mild residual  . Onychomadesis of toenail 04/22/2019  . Osteoporosis   . Peripheral arterial disease ([place])   . Peripheral vascular disease ([place]) 04/22/2019  . PONV (postoperative nausea and vomiting)   . Rhinitis   . Shortness of breath   . 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  . Trochanteric bursitis of left hip 03/26/2019  . Wheezing      Social History   Socioeconomic History  . Marital status: Divorced    Spouse name: Not on file  . Number of children: Not on file  . Years of education: Not on file  . Highest education level: Not on file  Occupational History  . Not on file  Tobacco Use  . Smoking status: Former Smoker    Packs/day: 1.00    Years: 50.00    Pack years: 50.00    Types: Cigarettes    Quit date: 03/17/2007  Years since quitting: 12.9  . Smokeless tobacco: Never Used  Vaping Use  . Vaping Use: Never used  Substance and Sexual Activity  . Alcohol use: Never  . Drug use: Never  . Sexual activity: Not Currently    Birth control/protection: None  Other Topics Concern  . Not on file  Social History Narrative  . Not on file   Social Determinants of Health   Financial Resource Strain: Low Risk   . Difficulty of Paying Living Expenses: Not hard at all  Food Insecurity: No Food Insecurity  . Worried About Charity fundraiser in the Last Year: Never true  . Ran Out of Food in the Last Year: Never true  Transportation Needs: No Transportation Needs  . Lack of Transportation  (Medical): No  . Lack of Transportation (Non-Medical): No  Physical Activity:   . Days of Exercise per Week: Not on file  . Minutes of Exercise per Session: Not on file  Stress:   . Feeling of Stress : Not on file  Social Connections:   . Frequency of Communication with Friends and Family: Not on file  . Frequency of Social Gatherings with Friends and Family: Not on file  . Attends Religious Services: Not on file  . Active Member of Clubs or Organizations: Not on file  . Attends Archivist Meetings: Not on file  . Marital Status: Not on file  Intimate Partner Violence:   . Fear of Current or Ex-Partner: Not on file  . Emotionally Abused: Not on file  . Physically Abused: Not on file  . Sexually Abused: Not on file    Past Surgical History:  Procedure Laterality Date  . ANGIOPLASTY    . Arteriography    . COLONOSCOPY    . EYE SURGERY     Laser vision correction  . FASCIOTOMY  03/2017   4 compartment.  . FEMORAL-POPLITEAL BYPASS GRAFT  03/2017   with Prosthetic for ciritcal lim ischemia, 2/2 thrombosis of existing graft.  . Iliofemoral Endarterectomy Right   . SKIN BIOPSY    . SPINE SURGERY     Fusion, Lumbar  . THROMBECTOMY FEMORAL ARTERY  03/2017    Family History  Problem Relation Age of Onset  . Hyperlipidemia Mother   . Pneumonia Father   . Hyperlipidemia Sister   . Hyperlipidemia Brother   . Osteoporosis Sister     Allergies  Allergen Reactions  . Latex   . Penicillins     "Mouth broke out in sores"  . Prednisone Diarrhea  . Tape     Current Outpatient Medications on File Prior to Visit  Medication Sig Dispense Refill  . acetaminophen (TYLENOL) 325 MG tablet Take 325 mg by mouth. May take 3 tablets daily as needed for pain    . aspirin EC 81 MG tablet Take 81 mg by mouth daily.    Marland Kitchen atorvastatin (LIPITOR) 80 MG tablet Take 1 tablet (80 mg total) by mouth daily. 90 tablet 3  . budesonide-formoterol (SYMBICORT) 80-4.5 MCG/ACT inhaler Inhale 2  puffs into the lungs 2 (two) times daily. 1 each 3  . ezetimibe (ZETIA) 10 MG tablet Take 1 tablet (10 mg total) by mouth daily. 90 tablet 3  . levocetirizine (XYZAL) 5 MG tablet Take 1 tablet (5 mg total) by mouth every evening. 30 tablet 3  . Vitamin D, Ergocalciferol, (DRISDOL) 1.25 MG (50000 UNIT) CAPS capsule Take 1 capsule by mouth once a week 8 capsule 0  . budesonide-formoterol (SYMBICORT)  80-4.5 MCG/ACT inhaler Inhale 2 puffs into the lungs 2 (two) times daily. 1 Inhaler 3  . methylPREDNISolone (MEDROL) 4 MG tablet 4 tab po day 1, 3 tab po day 2, 2 tab po day 3, 1 tab po day 4 10 tablet 0   No current facility-administered medications on file prior to visit.    BP 120/82 (BP Location: Left Arm, Patient Position: Sitting, Cuff Size: Normal)   Pulse 95   Temp 97.9 F (36.6 C) (Oral)   Resp 20   Ht 5\' 2"  (1.575 m)   Wt 121 lb 6.4 oz (55.1 kg)   SpO2 99%   BMI 22.20 kg/m       Objective:   Physical Exam  General- No acute distress. Pleasant patient. Neck- Full range of motion, no jvd Lungs- Clear, even and unlabored. Heart- regular rate and rhythm. Neurologic- CNII- XII grossly intact.  Musculoskeletal:     There is a 2 x 5 cm irregular skin tear to the left anterior shin.(wound looks well healed. Flexion extension intact at the ankle. There is no bony tenderness to the leg.  Skin:    General: Skin is warm and dry.Redness around wound and tenderenss in red area. Removed sutures no difficulty.  Abdomen- soft, nd, +bs, no rebound or guarding. Mild rt lower quadrant tenderenss. No obvious bulge of groin.  Back- no cva tenderness.  Heent- no frontal or maxillary sinus tenderness. Possible deviated septum.    Assessment & Plan:  Recent right lower quadrant pain over the past 2 weeks.  Pain has been intermittent.  History of remote hernia repair years ago.  Based on location do think would be best to go ahead and order CT abdomen pelvis to evaluate appendix.  Might find  incidental hernia though on physical exam I did not see any obvious bulge.  Will need to get prior authorization before test can be done.  We will go ahead and get CBC and metabolic panel today.  Recent suture placement in the emergency department.  Left lower extremity wound looks well-healed.  However does appear to have skin infection.  3 sutures removed without any problems.  Wound healed up well.  Medical assistant to apply 3 Steri-Strips to wound.  I did get wound culture and prescribed Bactrim DS pending wound culture results.  History of DVT and needs refill of Eliquis.  Print prescription given to patient.  History of chronic nasal congestion and possible deviated septum on exam.  Place referral to ENT.  Follow-up in 2 weeks or sooner if needed.  Mackie Pai, PA-C   Time spent with patient today was 40  minutes which consisted of chart reiview, discussing diagnoses, work up, treatment, answering questions , placing referral and documentation.

## 2020-02-18 NOTE — Addendum Note (Signed)
Addended by: Anabel Halon on: 02/18/2020 10:02 AM   Modules accepted: Orders

## 2020-02-19 ENCOUNTER — Encounter: Payer: Self-pay | Admitting: Medical

## 2020-02-20 ENCOUNTER — Telehealth: Payer: Self-pay | Admitting: Medical

## 2020-02-20 DIAGNOSIS — R1031 Right lower quadrant pain: Secondary | ICD-10-CM

## 2020-02-20 NOTE — Telephone Encounter (Signed)
Referral to surgeon placed.

## 2020-02-21 LAB — WOUND CULTURE
MICRO NUMBER:: 11268664
SPECIMEN QUALITY:: ADEQUATE

## 2020-02-25 ENCOUNTER — Telehealth: Payer: Self-pay | Admitting: Medical

## 2020-02-25 NOTE — Telephone Encounter (Signed)
Patient called in reference to a wound on her leg, patient states redness and painful wound to her leg. Patient offered and appointment for the next available and patient denied appoint b/c she would like to come today. No appointment available and offered patient to seek care with urgent care if pain continues.

## 2020-02-26 ENCOUNTER — Ambulatory Visit (INDEPENDENT_AMBULATORY_CARE_PROVIDER_SITE_OTHER)
Admission: RE | Admit: 2020-02-26 | Discharge: 2020-02-26 | Disposition: A | Discharge status: Home / Self Care | Payer: Medicare Other | Source: Ambulatory Visit | Attending: Physician Assistant | Admitting: Physician Assistant

## 2020-02-26 ENCOUNTER — Ambulatory Visit (HOSPITAL_COMMUNITY)
Admission: RE | Admit: 2020-02-26 | Discharge: 2020-02-26 | Disposition: A | Discharge status: Home / Self Care | Payer: Medicare Other | Source: Ambulatory Visit | Attending: Physician Assistant | Admitting: Physician Assistant

## 2020-02-26 ENCOUNTER — Other Ambulatory Visit: Payer: Self-pay

## 2020-02-26 ENCOUNTER — Ambulatory Visit (INDEPENDENT_AMBULATORY_CARE_PROVIDER_SITE_OTHER): Payer: Medicare Other | Admitting: Physician Assistant

## 2020-02-26 VITALS — BP 153/85 | HR 94 | Temp 98.1°F | Resp 20 | Ht 62.0 in | Wt 119.9 lb

## 2020-02-26 DIAGNOSIS — I739 Peripheral vascular disease, unspecified: Secondary | ICD-10-CM | POA: Insufficient documentation

## 2020-02-26 NOTE — Progress Notes (Signed)
History of Present Illness:  Patient is a 85 y.o. year old female who presents for evaluation of PAD s/p initial femoral to popliteal bypass in 2010 .  She presented emergently 2 years ago with an occluded femoral to popliteal bypass and underwent emergent common and deep femoral thrombectomy and right femoral to below-knee popliteal bypass with Gore-Tex.  Also underwent 4 compartment fasciotomy.    She denise symptoms of claudication, rest pain or no evidence of non healing wounds.  She did drop a box on he left anterior shin witch has been slow to heal and required stiches.  The stiches have been removed.   She denise fever and chills  Past Medical History:  Diagnosis Date  . Acute bilateral low back pain without sciatica 04/22/2019  . Arthritis   . Atypical chest pain 05/13/2019  . Cancer (HCC)    Basal cell carcinoma  . Cancer (HCC)    Squamous cell carcinoma  . COPD (chronic obstructive pulmonary disease) ([place])   . Coronary artery disease   . History of smoking 05/13/2019  . Hyperlipidemia   . Lipid disorder   . Melanoma ([place])   . Numbness and tingling of right leg    Mild residual  . Onychomadesis of toenail 04/22/2019  . Osteoporosis   . Peripheral arterial disease ([place])   . Peripheral vascular disease ([place]) 04/22/2019  . PONV (postoperative nausea and vomiting)   . Rhinitis   . Shortness of breath   . 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  . Trochanteric bursitis of left hip 03/26/2019  . Wheezing     Past Surgical History:  Procedure Laterality Date  . ANGIOPLASTY    . Arteriography    . COLONOSCOPY    . EYE SURGERY     Laser vision correction  . FASCIOTOMY  03/2017   4 compartment.  . FEMORAL-POPLITEAL BYPASS GRAFT  03/2017   with Prosthetic for ciritcal lim ischemia, 2/2 thrombosis of existing graft.  . Iliofemoral Endarterectomy Right   . SKIN BIOPSY    . SPINE SURGERY     Fusion, Lumbar  . THROMBECTOMY FEMORAL ARTERY  03/2017    ROS:    General:  No weight loss, Fever, chills  HEENT: No recent headaches, no nasal bleeding, no visual changes, no sore throat  Neurologic: No dizziness, blackouts, seizures. No recent symptoms of stroke or mini- stroke. No recent episodes of slurred speech, or temporary blindness.  Cardiac: No recent episodes of chest pain/pressure, no shortness of breath at rest.  No shortness of breath with exertion.  Denies history of atrial fibrillation or irregular heartbeat  Vascular: No history of rest pain in feet.  No history of claudication.  No history of non-healing ulcer, No history of DVT   Pulmonary: No home oxygen, no productive cough, no hemoptysis,  No asthma or wheezing  Musculoskeletal:  [ ]  Arthritis, [ ]  Low back pain,  [ ]  Joint pain  Hematologic:No history of hypercoagulable state.  No history of easy bleeding.  No history of anemia  Gastrointestinal: No hematochezia or melena,  No gastroesophageal reflux, no trouble swallowing  Urinary: [ ]  chronic Kidney disease, [ ]  on HD - [ ]  MWF or [ ]  TTHS, [ ]  Burning with urination, [ ]  Frequent urination, [ ]  Difficulty urinating;   Skin: No rashes  Psychological: No history of anxiety,  No history of depression  Social History Social History   Tobacco Use  . Smoking status:  Former Smoker    Packs/day: 1.00    Years: 50.00    Pack years: 50.00    Types: Cigarettes    Quit date: 03/17/2007    Years since quitting: 12.9  . Smokeless tobacco: Never Used  Vaping Use  . Vaping Use: Never used  Substance Use Topics  . Alcohol use: Never  . Drug use: Never    Family History Family History  Problem Relation Age of Onset  . Hyperlipidemia Mother   . Pneumonia Father   . Hyperlipidemia Sister   . Hyperlipidemia Brother   . Osteoporosis Sister     Allergies  Allergies  Allergen Reactions  . Latex   . Penicillins     "Mouth broke out in sores"  . Prednisone Diarrhea  . Tape      Current Outpatient Medications   Medication Sig Dispense Refill  . acetaminophen (TYLENOL) 325 MG tablet Take 325 mg by mouth. May take 3 tablets daily as needed for pain    . apixaban (ELIQUIS) 2.5 MG TABS tablet Take 1 tablet (2.5 mg total) by mouth 2 (two) times daily. 60 tablet 5  . aspirin EC 81 MG tablet Take 81 mg by mouth daily.    Marland Kitchen atorvastatin (LIPITOR) 80 MG tablet Take 1 tablet (80 mg total) by mouth daily. 90 tablet 3   No current facility-administered medications for this visit.    Physical Examination  Vitals:   02/26/20 1012  BP: (!) 153/85  Pulse: 94  Resp: 20  Temp: 98.1 F (36.7 C)  TempSrc: Temporal  Weight: 119 lb 14.4 oz (54.4 kg)  Height: 5\' 2"  (1.575 m)    Body mass index is 21.93 kg/m.  General:  Alert and oriented, no acute distress HEENT: Normal Neck: No bruit or JVD Pulmonary: Clear to auscultation bilaterally Cardiac: Regular Rate and Rhythm without murmur Abdomen: Soft, non-tender, non-distended, no mass, no scars Skin: No rash, left anterior shin superficial wound without evidence of infection.  Clean dry dressing applied. Extremity Pulses:  2+ radial, brachial, femoral, left dorsalis pedis, posterior tibial palpable pulses Musculoskeletal: No deformity or edema  Neurologic: Upper and lower extremity motor 5/5 and symmetric  DATA:     +----------+--------+-----+--------+---------+--------------------------+  RIGHT   PSV cm/sRatioStenosisWaveform Comments           +----------+--------+-----+--------+---------+--------------------------+  CFA Distal145          triphasicScattered calcified plaque  +----------+--------+-----+--------+---------+--------------------------+       Right Graft #1: Fem - pop  +------------------+--------+--------+---------+--------+           PSV cm/sStenosisWaveform Comments  +------------------+--------+--------+---------+--------+  Inflow      115       triphasic       +------------------+--------+--------+---------+--------+  Prox Anastomosis 116       triphasic      +------------------+--------+--------+---------+--------+  Proximal Graft  52       triphasic      +------------------+--------+--------+---------+--------+  Mid Graft     53       biphasic       +------------------+--------+--------+---------+--------+  Distal Graft   62       biphasic       +------------------+--------+--------+---------+--------+  Distal Anastomosis51       biphasic       +------------------+--------+--------+---------+--------+  Outflow      77       biphasic       +------------------+--------+--------+---------+--------+    Summary:  Right: Near normal examination. Two fem-pop bypass grafts identified. One  occluded and  one patent with no evidence of stenosis.      ABI Findings:  +---------+------------------+-----+---------+--------+  Right  Rt Pressure (mmHg)IndexWaveform Comment   +---------+------------------+-----+---------+--------+  Brachial 136                      +---------+------------------+-----+---------+--------+  ATA   131        0.94            +---------+------------------+-----+---------+--------+  PTA   148        1.06 biphasic       +---------+------------------+-----+---------+--------+  DP                triphasic      +---------+------------------+-----+---------+--------+  Great Toe59        0.42            +---------+------------------+-----+---------+--------+   +---------+------------------+-----+---------+-------+  Left   Lt Pressure (mmHg)IndexWaveform Comment  +---------+------------------+-----+---------+-------+  Brachial 140                       +---------+------------------+-----+---------+-------+  ATA   135        0.96            +---------+------------------+-----+---------+-------+  PTA   135        0.96 triphasic      +---------+------------------+-----+---------+-------+  DP                triphasic      +---------+------------------+-----+---------+-------+  Great Toe44        0.31            +---------+------------------+-----+---------+-------+   +-------+-----------+-----------+------------+------------+  ABI/TBIToday's ABIToday's TBIPrevious ABIPrevious TBI  +-------+-----------+-----------+------------+------------+  Right 1.06    0.42    1.09    0.60      +-------+-----------+-----------+------------+------------+  Left  0.96    0.31    1.19    0.77      +-------+-----------+-----------+------------+------------+     Bilateral ABIs appear essentially unchanged compared to prior study on  07/22/2019.    Summary:  Right: Resting right ankle-brachial index is within normal range. No  evidence of significant right lower extremity arterial disease. The right  toe-brachial index is abnormal. RT great toe pressure = 59 mmHg.   Left: Resting left ankle-brachial index is within normal range. No  evidence of significant left lower extremity arterial disease. The left  toe-brachial index is abnormal. LT Great toe pressure = 44 mmHg.    ASSESSMENT: Right LE s/p fem-pop bypass x 2 with second being  Gore-Tex The Bypass graft is patent without stenosis with good ABI's biphasic/triphasic flow without change from previous study The left anterior shin appears to be healing well dry dressing applied.   PLAN:  Stable LE exam with patent right LE bypass.  She will wash the left leg with soap and water.  Dry dressing when going out, dressing can be left off when at home.  She will walk as  much as she tolerates daily.  F/U with repeat ABI's and right LE bypass duplex in 6 months.  Continue daily ASA, Eliquis and Statin   Roxy Horseman PA-C [facility] Office: [phone]  MD in clinic [place]

## 2020-03-01 ENCOUNTER — Other Ambulatory Visit: Payer: Self-pay

## 2020-03-01 DIAGNOSIS — I739 Peripheral vascular disease, unspecified: Secondary | ICD-10-CM

## 2020-03-04 ENCOUNTER — Ambulatory Visit: Payer: Medicare Other | Admitting: Medical

## 2020-03-08 ENCOUNTER — Other Ambulatory Visit: Payer: Self-pay

## 2020-03-08 ENCOUNTER — Ambulatory Visit (INDEPENDENT_AMBULATORY_CARE_PROVIDER_SITE_OTHER): Payer: Medicare Other | Admitting: Family

## 2020-03-08 ENCOUNTER — Encounter: Payer: Self-pay | Admitting: Family

## 2020-03-08 VITALS — BP 160/90 | HR 93 | Temp 98.0°F | Resp 16 | Ht 62.0 in | Wt 120.2 lb

## 2020-03-08 DIAGNOSIS — R350 Frequency of micturition: Secondary | ICD-10-CM | POA: Diagnosis not present

## 2020-03-08 DIAGNOSIS — I10 Essential (primary) hypertension: Secondary | ICD-10-CM

## 2020-03-08 DIAGNOSIS — Z7185 Encounter for immunization safety counseling: Secondary | ICD-10-CM | POA: Diagnosis not present

## 2020-03-08 LAB — POC URINALSYSI DIPSTICK (AUTOMATED)
Bilirubin, UA: NEGATIVE
Blood, UA: NEGATIVE
Glucose, UA: NEGATIVE
Ketones, UA: NEGATIVE
Leukocytes, UA: NEGATIVE
Nitrite, UA: NEGATIVE
Protein, UA: POSITIVE — AB
Spec Grav, UA: 1.02 (ref 1.010–1.025)
Urobilinogen, UA: NEGATIVE E.U./dL — AB
pH, UA: 6 (ref 5.0–8.0)

## 2020-03-08 MED ORDER — CIPROFLOXACIN HCL 250 MG PO TABS: 250.0000 mg | ORAL_TABLET | Freq: Two times a day (BID) | ORAL | 0 refills | Status: DC

## 2020-03-08 MED ORDER — METOPROLOL SUCCINATE ER 50 MG PO TB24: 50.0000 mg | ORAL_TABLET | Freq: Every day | ORAL | 3 refills | Status: AC

## 2020-03-08 NOTE — Progress Notes (Signed)
Subjective:    Patient ID: Holly Briggs, female    DOB: 08/19/1934, 85 y.o.   MRN: [id number]  HPI  Patient is an 85 yr old female who presents today with chief complaint of urinary frequency/urgency.  She reports that her symptoms seem to be worse over the last 10 hours.  Denies dysuria, fever, hematuria.    Reports fatigue since she has had the frequency.     Review of Systems See HPI  Past Medical History:  Diagnosis Date  . Acute bilateral low back pain without sciatica 04/22/2019  . Arthritis   . Atypical chest pain 05/13/2019  . Cancer (HCC)    Basal cell carcinoma  . Cancer (HCC)    Squamous cell carcinoma  . COPD (chronic obstructive pulmonary disease) ([place])   . Coronary artery disease   . History of smoking 05/13/2019  . Hyperlipidemia   . Lipid disorder   . Melanoma ([place])   . Numbness and tingling of right leg    Mild residual  . Onychomadesis of toenail 04/22/2019  . Osteoporosis   . Peripheral arterial disease ([place])   . Peripheral vascular disease ([place]) 04/22/2019  . PONV (postoperative nausea and vomiting)   . Rhinitis   . Shortness of breath   . 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  . Trochanteric bursitis of left hip 03/26/2019  . Wheezing      Social History   Socioeconomic History  . Marital status: Divorced    Spouse name: Not on file  . Number of children: Not on file  . Years of education: Not on file  . Highest education level: Not on file  Occupational History  . Not on file  Tobacco Use  . Smoking status: Former Smoker    Packs/day: 1.00    Years: 50.00    Pack years: 50.00    Types: Cigarettes    Quit date: 03/17/2007    Years since quitting: 12.9  . Smokeless tobacco: Never Used  Vaping Use  . Vaping Use: Never used  Substance and Sexual Activity  . Alcohol use: Never  . Drug use: Never  . Sexual activity: Not Currently    Birth control/protection: None  Other Topics Concern  . Not on file   Social History Narrative  . Not on file   Social Determinants of Health   Financial Resource Strain: Low Risk   . Difficulty of Paying Living Expenses: Not hard at all  Food Insecurity: No Food Insecurity  . Worried About Charity fundraiser in the Last Year: Never true  . Ran Out of Food in the Last Year: Never true  Transportation Needs: No Transportation Needs  . Lack of Transportation (Medical): No  . Lack of Transportation (Non-Medical): No  Physical Activity: Not on file  Stress: Not on file  Social Connections: Not on file  Intimate Partner Violence: Not on file    Past Surgical History:  Procedure Laterality Date  . ANGIOPLASTY    . Arteriography    . COLONOSCOPY    . EYE SURGERY     Laser vision correction  . FASCIOTOMY  03/2017   4 compartment.  . FEMORAL-POPLITEAL BYPASS GRAFT  03/2017   with Prosthetic for ciritcal lim ischemia, 2/2 thrombosis of existing graft.  . Iliofemoral Endarterectomy Right   . SKIN BIOPSY    . SPINE SURGERY     Fusion, Lumbar  . THROMBECTOMY FEMORAL ARTERY  03/2017    Family History  Problem Relation Age of Onset  . Hyperlipidemia Mother   . Pneumonia Father   . Hyperlipidemia Sister   . Hyperlipidemia Brother   . Osteoporosis Sister     Allergies  Allergen Reactions  . Latex   . Penicillins     "Mouth broke out in sores"  . Prednisone Diarrhea  . Tape     Current Outpatient Medications on File Prior to Visit  Medication Sig Dispense Refill  . acetaminophen (TYLENOL) 325 MG tablet Take 325 mg by mouth. May take 3 tablets daily as needed for pain    . apixaban (ELIQUIS) 2.5 MG TABS tablet Take 1 tablet (2.5 mg total) by mouth 2 (two) times daily. 60 tablet 5  . aspirin EC 81 MG tablet Take 81 mg by mouth daily.    Marland Kitchen atorvastatin (LIPITOR) 80 MG tablet Take 1 tablet (80 mg total) by mouth daily. 90 tablet 3   No current facility-administered medications on file prior to visit.    BP (!) 160/90 (BP Location: Right  Arm, Patient Position: Sitting, Cuff Size: Small)   Pulse 93   Temp 98 F (36.7 C) (Oral)   Resp 16   Ht 5\' 2"  (1.575 m)   Wt 120 lb 3.2 oz (54.5 kg)   SpO2 100%   BMI 21.98 kg/m       Objective:   Physical Exam Constitutional:      Appearance: She is well-developed and well-nourished.  Neck:     Thyroid: No thyromegaly.  Cardiovascular:     Rate and Rhythm: Normal rate and regular rhythm.     Heart sounds: Normal heart sounds. No murmur heard.   Pulmonary:     Effort: Pulmonary effort is normal. No respiratory distress.     Breath sounds: Normal breath sounds. No wheezing.  Musculoskeletal:     Cervical back: Neck supple.  Skin:    General: Skin is warm and dry.  Neurological:     Mental Status: She is alert and oriented to person, place, and time.  Psychiatric:        Mood and Affect: Mood and affect normal.        Behavior: Behavior normal.        Thought Content: Thought content normal.        Judgment: Judgment normal.           Assessment & Plan:  Urinary frequency- UA unremarkable but hx concerning. Will send urine for culture and begin empiric cipro.   Immunization counseling- discussed her increased risk of severe infection and even death from covid-19  given her age and medical history. She declines vaccination.   HTN- new diagnosis for her.  Will initiate toprol xl 50mg  once daily.   BP Readings from Last 3 Encounters:  03/08/20 (!) 160/90  02/26/20 (!) 153/85  02/18/20 120/82   This visit occurred during the SARS-CoV-2 public health emergency.  Safety protocols were in place, including screening questions prior to the visit, additional usage of staff PPE, and extensive cleaning of exam room while observing appropriate contact time as indicated for disinfecting solutions.

## 2020-03-08 NOTE — Patient Instructions (Signed)
Please start cipro for possible urinary tract infection. Begin toprol xl 50mg  once daily.

## 2020-03-09 LAB — URINE CULTURE
MICRO NUMBER:: 11343114
Result:: NO GROWTH
SPECIMEN QUALITY:: ADEQUATE

## 2020-03-10 ENCOUNTER — Telehealth: Payer: Self-pay | Admitting: Family

## 2020-03-10 MED ORDER — SULFAMETHOXAZOLE-TRIMETHOPRIM 400-80 MG PO TABS: 1.0000 | ORAL_TABLET | Freq: Two times a day (BID) | ORAL | 0 refills | Status: AC

## 2020-03-10 NOTE — Telephone Encounter (Signed)
Patient states she can not take ciprofloxacin (CIPRO) 250 MG tablet [[id number]] . B/c of the side effects  Patient is requesting anything medication be sent to pharmacy for her uti.

## 2020-03-10 NOTE — Telephone Encounter (Signed)
Lvm for patient to call back about urine culture results. Prescription for antibiotics was cancelled.

## 2020-03-10 NOTE — Telephone Encounter (Signed)
Medication change requested

## 2020-03-10 NOTE — Telephone Encounter (Signed)
Actually, I just got back her urine culture and it is negative for infection. I don't think she needs an antibiotic right now- can you please cancel the bactrim at her pharmacy?  Her symptoms could be due to overactive bladder.  Let me know if she is interested in trying a medication once daily for overactive bladder and if so, I will send it to her pharmacy.

## 2020-03-10 NOTE — Telephone Encounter (Signed)
Advised patient of results and advise from provider. She will "thing about oab medication and call back if she decides to give it a try"

## 2020-03-10 NOTE — Telephone Encounter (Signed)
New rx sent for Bactrim.

## 2020-03-23 ENCOUNTER — Other Ambulatory Visit: Payer: Self-pay

## 2020-03-23 ENCOUNTER — Ambulatory Visit (INDEPENDENT_AMBULATORY_CARE_PROVIDER_SITE_OTHER): Payer: Medicare Other | Admitting: Otolaryngology

## 2020-03-23 ENCOUNTER — Encounter (INDEPENDENT_AMBULATORY_CARE_PROVIDER_SITE_OTHER): Payer: Self-pay | Admitting: Otolaryngology

## 2020-03-23 VITALS — Temp 97.0°F

## 2020-03-23 DIAGNOSIS — H6121 Impacted cerumen, right ear: Secondary | ICD-10-CM | POA: Diagnosis not present

## 2020-03-23 DIAGNOSIS — J310 Chronic rhinitis: Secondary | ICD-10-CM

## 2020-03-23 NOTE — Progress Notes (Signed)
HPI: Holly Briggs is a 86 y.o. female who presents is referred by her PCP for evaluation of chronic nasal problems.  She states that her sinuses are always blocked and that she has had this for years.  She does not feel like she can breathe well through her nose.  She states that she has had her sinuses "blown out" before.  She wanted her nose checked She states that it helps when she sneezes..  Past Medical History:  Diagnosis Date  . Acute bilateral low back pain without sciatica 04/22/2019  . Arthritis   . Atypical chest pain 05/13/2019  . Cancer (HCC)    Basal cell carcinoma  . Cancer (HCC)    Squamous cell carcinoma  . COPD (chronic obstructive pulmonary disease) (HCC)   . Coronary artery disease   . History of smoking 05/13/2019  . Hyperlipidemia   . Lipid disorder   . Melanoma (HCC)   . Numbness and tingling of right leg    Mild residual  . Onychomadesis of toenail 04/22/2019  . Osteoporosis   . Peripheral arterial disease (HCC)   . Peripheral vascular disease (HCC) 04/22/2019  . PONV (postoperative nausea and vomiting)   . Rhinitis   . Shortness of breath   . 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  . Trochanteric bursitis of left hip 03/26/2019  . Wheezing    Past Surgical History:  Procedure Laterality Date  . ANGIOPLASTY    . Arteriography    . COLONOSCOPY    . EYE SURGERY     Laser vision correction  . FASCIOTOMY  03/2017   4 compartment.  . FEMORAL-POPLITEAL BYPASS GRAFT  03/2017   with Prosthetic for ciritcal lim ischemia, 2/2 thrombosis of existing graft.  . Iliofemoral Endarterectomy Right   . SKIN BIOPSY    . SPINE SURGERY     Fusion, Lumbar  . THROMBECTOMY FEMORAL ARTERY  03/2017   Social History   Socioeconomic History  . Marital status: Divorced    Spouse name: Not on file  . Number of children: Not on file  . Years of education: Not on file  . Highest education level: Not on file  Occupational History  . Not on file   Tobacco Use  . Smoking status: Former Smoker    Packs/day: 1.00    Years: 50.00    Pack years: 50.00    Types: Cigarettes    Quit date: 03/17/2007    Years since quitting: 13.0  . Smokeless tobacco: Never Used  Vaping Use  . Vaping Use: Never used  Substance and Sexual Activity  . Alcohol use: Never  . Drug use: Never  . Sexual activity: Not Currently    Birth control/protection: None  Other Topics Concern  . Not on file  Social History Narrative  . Not on file   Social Determinants of Health   Financial Resource Strain: Low Risk   . Difficulty of Paying Living Expenses: Not hard at all  Food Insecurity: No Food Insecurity  . Worried About Programme researcher, broadcasting/film/video in the Last Year: Never true  . Ran Out of Food in the Last Year: Never true  Transportation Needs: No Transportation Needs  . Lack of Transportation (Medical): No  . Lack of Transportation (Non-Medical): No  Physical Activity: Not on file  Stress: Not on file  Social Connections: Not on file   Family History  Problem Relation Age of Onset  . Hyperlipidemia Mother   . Pneumonia  Father   . Hyperlipidemia Sister   . Hyperlipidemia Brother   . Osteoporosis Sister    Allergies  Allergen Reactions  . Latex   . Penicillins     "Mouth broke out in sores"  . Prednisone Diarrhea  . Tape    Prior to Admission medications   Medication Sig Start Date End Date Taking? Authorizing Provider  acetaminophen (TYLENOL) 325 MG tablet Take 325 mg by mouth. May take 3 tablets daily as needed for pain    [provider]  apixaban (ELIQUIS) 2.5 MG TABS tablet Take 1 tablet (2.5 mg total) by mouth 2 (two) times daily. 02/18/20   Saguier, Percell Miller, PA-C  aspirin EC 81 MG tablet Take 81 mg by mouth daily.    [provider]  atorvastatin (LIPITOR) 80 MG tablet Take 1 tablet (80 mg total) by mouth daily. 03/17/19   Saguier, Percell Miller, PA-C  metoprolol succinate (TOPROL-XL) 50 MG 24 hr tablet Take 1 tablet (50 mg total)  by mouth daily. Take with or immediately following a meal. 03/08/20   Debbrah Alar, NP     Positive ROS: Otherwise negative  All other systems have been reviewed and were otherwise negative with the exception of those mentioned in the HPI and as above.  Physical Exam: Constitutional: Alert, well-appearing, no acute distress Ears: External ears without lesions or tenderness.  Left ear canal and TM are clear.  Right ear canal reveals a large amount of wax that was down in the ear canal that was removed with forceps.  The right TM is clear after removing the wax and she felt like she heard better. Nasal: External nose without lesions. Septum with minimal deformity.  Mild rhinitis.  After decongesting the nose both nasal passages were clear.  There were no polyps.  Both middle meatus regions were clear with no signs of infection.. Clear nasal passages.  After decongesting the nose she had no difficulty breathing through either side of the nose. Oral: Lips and gums without lesions. Tongue and palate mucosa without lesions. Posterior oropharynx clear.  She is status post tonsillectomy. Neck: No palpable adenopathy or masses Respiratory: Breathing comfortably  Skin: No facial/neck lesions or rash noted.  Cerumen impaction removal  Date/Time: 03/23/2020 1:27 PM Performed by: Rozetta Nunnery, MD Authorized by: Rozetta Nunnery, MD   Consent:    Consent obtained:  Verbal   Consent given by:  Patient   Risks discussed:  Pain and bleeding Procedure details:    Location:  R ear   Procedure type: forceps   Post-procedure details:    Inspection:  TM intact and canal normal   Hearing quality:  Improved   Patient tolerance of procedure:  Tolerated well, no immediate complications Comments:     Right ear canal had a large amount of wax that was removed with forceps..  TM was otherwise clear.    Assessment: Nasal obstruction secondary to chronic rhinitis with clear nasal passages  otherwise. Right ear canal cerumen buildup.  Plan: Recommended regular use of Nasacort 2 sprays each nostril at night as this will provide some congestion relief.  Also discussed with her concerning use of saline irrigations if she has a lot of mucus buildup in the nose. She will follow-up as needed.   Radene Journey, MD   CC:

## 2020-03-25 ENCOUNTER — Ambulatory Visit (INDEPENDENT_AMBULATORY_CARE_PROVIDER_SITE_OTHER): Payer: Medicare Other | Admitting: Podiatry

## 2020-03-25 ENCOUNTER — Other Ambulatory Visit: Payer: Self-pay

## 2020-03-25 DIAGNOSIS — M79675 Pain in left toe(s): Secondary | ICD-10-CM

## 2020-03-25 DIAGNOSIS — B351 Tinea unguium: Secondary | ICD-10-CM

## 2020-03-25 DIAGNOSIS — M79674 Pain in right toe(s): Secondary | ICD-10-CM | POA: Diagnosis not present

## 2020-03-28 ENCOUNTER — Other Ambulatory Visit: Payer: Self-pay

## 2020-03-28 ENCOUNTER — Ambulatory Visit (INDEPENDENT_AMBULATORY_CARE_PROVIDER_SITE_OTHER): Payer: Medicare Other | Admitting: Family

## 2020-03-28 VITALS — BP 140/89 | HR 64 | Temp 98.4°F | Resp 14 | Wt 120.4 lb

## 2020-03-28 DIAGNOSIS — R109 Unspecified abdominal pain: Secondary | ICD-10-CM | POA: Diagnosis not present

## 2020-03-28 DIAGNOSIS — N3281 Overactive bladder: Secondary | ICD-10-CM

## 2020-03-28 DIAGNOSIS — I10 Essential (primary) hypertension: Secondary | ICD-10-CM

## 2020-03-28 MED ORDER — MIRABEGRON ER 25 MG PO TB24: 25.0000 mg | ORAL_TABLET | Freq: Every day | ORAL | 2 refills | Status: AC

## 2020-03-28 NOTE — Progress Notes (Signed)
Subjective:    Patient ID: Holly Briggs, female    DOB: 21-Oct-1934, 86 y.o.   MRN: [id number]  HPI   Patient is a 86 year old female who presents today for follow-up.  She was last seen on March 08, 2020, at which time she complained of urinary frequency and urgency.  UA was unremarkable, however given her history she was placed on empiric Cipro and her urine was sent for culture. She later called back and requested a different antibiotic due to "side effects" of cipro. Of note, she states she never did start the cipro. We changed to bactrim, however rx was ultimately cancelled because urine culture was negative. Today she reports ongoing urinary frequency and also reports one episode of incontinence.   HTN- last visit we added toprol xl 50mg .  BP Readings from Last 3 Encounters:  03/28/20 140/89  03/08/20 (!) 160/90  02/26/20 (!) 153/85   She is also concerned about the possibility of an abdominal hernia.  She states that sometimes she has a bulging on the right side of her abdomen.  Review of Systems See HPI  Past Medical History:  Diagnosis Date  . Acute bilateral low back pain without sciatica 04/22/2019  . Arthritis   . Atypical chest pain 05/13/2019  . Cancer (HCC)    Basal cell carcinoma  . Cancer (HCC)    Squamous cell carcinoma  . COPD (chronic obstructive pulmonary disease) ([place])   . Coronary artery disease   . History of smoking 05/13/2019  . Hyperlipidemia   . Lipid disorder   . Melanoma ([place])   . Numbness and tingling of right leg    Mild residual  . Onychomadesis of toenail 04/22/2019  . Osteoporosis   . Peripheral arterial disease ([place])   . Peripheral vascular disease ([place]) 04/22/2019  . PONV (postoperative nausea and vomiting)   . Rhinitis   . Shortness of breath   . 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  . Trochanteric bursitis of left hip 03/26/2019  . Wheezing      Social History   Socioeconomic History  . Marital  status: Divorced    Spouse name: Not on file  . Number of children: Not on file  . Years of education: Not on file  . Highest education level: Not on file  Occupational History  . Not on file  Tobacco Use  . Smoking status: Former Smoker    Packs/day: 1.00    Years: 50.00    Pack years: 50.00    Types: Cigarettes    Quit date: 03/17/2007    Years since quitting: 13.0  . Smokeless tobacco: Never Used  Vaping Use  . Vaping Use: Never used  Substance and Sexual Activity  . Alcohol use: Never  . Drug use: Never  . Sexual activity: Not Currently    Birth control/protection: None  Other Topics Concern  . Not on file  Social History Narrative  . Not on file   Social Determinants of Health   Financial Resource Strain: Low Risk   . Difficulty of Paying Living Expenses: Not hard at all  Food Insecurity: No Food Insecurity  . Worried About Charity fundraiser in the Last Year: Never true  . Ran Out of Food in the Last Year: Never true  Transportation Needs: No Transportation Needs  . Lack of Transportation (Medical): No  . Lack of Transportation (Non-Medical): No  Physical Activity: Not on file  Stress: Not on file  Social  Connections: Not on file  Intimate Partner Violence: Not on file    Past Surgical History:  Procedure Laterality Date  . ANGIOPLASTY    . Arteriography    . COLONOSCOPY    . EYE SURGERY     Laser vision correction  . FASCIOTOMY  03/2017   4 compartment.  . FEMORAL-POPLITEAL BYPASS GRAFT  03/2017   with Prosthetic for ciritcal lim ischemia, 2/2 thrombosis of existing graft.  . Iliofemoral Endarterectomy Right   . SKIN BIOPSY    . SPINE SURGERY     Fusion, Lumbar  . THROMBECTOMY FEMORAL ARTERY  03/2017    Family History  Problem Relation Age of Onset  . Hyperlipidemia Mother   . Pneumonia Father   . Hyperlipidemia Sister   . Hyperlipidemia Brother   . Osteoporosis Sister     Allergies  Allergen Reactions  . Latex   . Penicillins      "Mouth broke out in sores"  . Prednisone Diarrhea  . Tape     Current Outpatient Medications on File Prior to Visit  Medication Sig Dispense Refill  . acetaminophen (TYLENOL) 325 MG tablet Take 325 mg by mouth. May take 3 tablets daily as needed for pain    . apixaban (ELIQUIS) 2.5 MG TABS tablet Take 1 tablet (2.5 mg total) by mouth 2 (two) times daily. 60 tablet 5  . aspirin EC 81 MG tablet Take 81 mg by mouth daily.    Marland Kitchen atorvastatin (LIPITOR) 80 MG tablet Take 1 tablet (80 mg total) by mouth daily. 90 tablet 3  . metoprolol succinate (TOPROL-XL) 50 MG 24 hr tablet Take 1 tablet (50 mg total) by mouth daily. Take with or immediately following a meal. 50 tablet 3   No current facility-administered medications on file prior to visit.    BP 140/89 (BP Location: Right Arm, Patient Position: Sitting, Cuff Size: Normal)   Pulse 64   Temp 98.4 F (36.9 C) (Oral)   Resp 14   Wt 120 lb 6.4 oz (54.6 kg)   SpO2 100%   BMI 22.02 kg/m       Objective:   Physical Exam Constitutional:      Appearance: She is well-developed and well-nourished.  Neck:     Thyroid: No thyromegaly.  Cardiovascular:     Rate and Rhythm: Normal rate and regular rhythm.     Heart sounds: Normal heart sounds. No murmur heard.   Pulmonary:     Effort: Pulmonary effort is normal. No respiratory distress.     Breath sounds: Normal breath sounds. No wheezing.  Abdominal:     Palpations: Abdomen is soft.     Comments: Mild abdominal tenderness right lateral abdominal wall without bulging.  Patient declined to have exam laying flat.  As a result she was examined upright which limited examination.  Musculoskeletal:     Cervical back: Neck supple.  Skin:    General: Skin is warm and dry.  Neurological:     Mental Status: She is alert and oriented to person, place, and time.  Psychiatric:        Mood and Affect: Mood and affect normal.        Behavior: Behavior normal.        Thought Content: Thought content  normal.        Judgment: Judgment normal.           Assessment & Plan:  Hypertension-follow-up blood pressure is acceptable for her age.  She is tolerating Toprol-XL  without difficulty.  We will continue current dose 50 mg.  Abdominal discomfort- advised pt to keep her upcoming appointment with the surgeon.  It looks like she never completed the CT scan recommended by PA Saguier. Pt prefers to address with the surgeon on 03/31/20.  Overactive bladder- trial of myrbetriq.  She is requesting to see an MD.  I advised her to speak to the front desk and she can request to establish with an MD at our practice who is accepting new patients.  This visit occurred during the SARS-CoV-2 public health emergency.  Safety protocols were in place, including screening questions prior to the visit, additional usage of staff PPE, and extensive cleaning of exam room while observing appropriate contact time as indicated for disinfecting solutions.

## 2020-03-28 NOTE — Patient Instructions (Signed)
Please add myrbetriq once daily for overactive bladder.   Continue Toprol XL once daily.

## 2020-03-29 ENCOUNTER — Encounter: Payer: Self-pay | Admitting: Podiatry

## 2020-03-29 NOTE — Progress Notes (Signed)
  Subjective:  Patient ID: Holly Briggs, female    DOB: [birth year],  MRN: [id number]  Chief Complaint  Patient presents with  . Callouses    Right foot callus    86 y.o. female returns for the above complaint.  Patient presents with thickened elongated dystrophic toenails x10.  Patient would like to have them debrided down as she is not able to do it herself.  She states that they are gone long and she is not unable to bend away. She does not have any secondary complaints  Objective:  There were no vitals filed for this visit. Podiatric Exam: Vascular: dorsalis pedis and posterior tibial pulses are palpable bilateral. Capillary return is immediate. Temperature gradient is WNL. Skin turgor WNL  Sensorium: Normal Semmes Weinstein monofilament test. Normal tactile sensation bilaterally. Nail Exam: Pt has thick disfigured discolored nails with subungual debris noted bilateral entire nail hallux through fifth toenails.  Pain on palpation to the nails. Ulcer Exam: There is no evidence of ulcer or pre-ulcerative changes or infection. Orthopedic Exam: Muscle tone and strength are WNL. No limitations in general ROM. No crepitus or effusions noted. HAV  B/L.  Hammer toes 2-5  B/L. Skin:  No infection or ulcers.  Hyperkeratotic lesion/porokeratosis noted to submetatarsal two.  Pain on palpation to the lesion.  Upon debridement no pinpoint bleeding or ulceration noted.    Assessment & Plan:   1. Pain due to onychomycosis of toenails of both feet     Patient was evaluated and treated and all questions answered.  Right submetatarsal two porokeratosis -I explained to the patient the etiology of porokeratosis and various treatment options were discussed.  I believe patient will benefit from aggressive debridement of the lesion.  Using chisel blade to handle the lesion was debrided down to healthy striated tissue.  No pinpoint bleeding noted.  Pain relief was noted after  debridement. -Metatarsal pad was also dispensed  Onychomycosis with pain  -Nails palliatively debrided as below. -Educated on self-care  Procedure: Nail Debridement Rationale: pain  Type of Debridement: manual, sharp debridement. Instrumentation: Nail nipper, rotary burr. Number of Nails: 10  Procedures and Treatment: Consent by patient was obtained for treatment procedures. The patient understood the discussion of treatment and procedures well. All questions were answered thoroughly reviewed. Debridement of mycotic and hypertrophic toenails, 1 through 5 bilateral and clearing of subungual debris. No ulceration, no infection noted.  Return Visit-Office Procedure: Patient instructed to return to the office for a follow up visit 3 months for continued evaluation and treatment.  Boneta Lucks, DPM    No follow-ups on file.

## 2020-05-09 ENCOUNTER — Ambulatory Visit: Payer: Medicare Other | Admitting: Family

## 2020-05-11 ENCOUNTER — Other Ambulatory Visit: Payer: Self-pay | Admitting: Medical

## 2020-06-24 ENCOUNTER — Ambulatory Visit: Payer: Medicare Other | Admitting: Podiatry

## 2020-10-03 ENCOUNTER — Other Ambulatory Visit: Payer: Self-pay

## 2020-10-03 ENCOUNTER — Encounter (HOSPITAL_BASED_OUTPATIENT_CLINIC_OR_DEPARTMENT_OTHER): Payer: Self-pay

## 2020-10-03 ENCOUNTER — Emergency Department (HOSPITAL_BASED_OUTPATIENT_CLINIC_OR_DEPARTMENT_OTHER)
Admission: EM | Admit: 2020-10-03 | Discharge: 2020-10-03 | Disposition: A | Discharge status: Home / Self Care | Payer: Medicare Other | Attending: Emergency Medicine | Admitting: Emergency Medicine

## 2020-10-03 DIAGNOSIS — Z87891 Personal history of nicotine dependence: Secondary | ICD-10-CM | POA: Diagnosis not present

## 2020-10-03 DIAGNOSIS — Z7901 Long term (current) use of anticoagulants: Secondary | ICD-10-CM | POA: Diagnosis not present

## 2020-10-03 DIAGNOSIS — Z86008 Personal history of in-situ neoplasm of other site: Secondary | ICD-10-CM | POA: Diagnosis not present

## 2020-10-03 DIAGNOSIS — J449 Chronic obstructive pulmonary disease, unspecified: Secondary | ICD-10-CM | POA: Diagnosis not present

## 2020-10-03 DIAGNOSIS — Z955 Presence of coronary angioplasty implant and graft: Secondary | ICD-10-CM | POA: Insufficient documentation

## 2020-10-03 DIAGNOSIS — Z85820 Personal history of malignant melanoma of skin: Secondary | ICD-10-CM | POA: Insufficient documentation

## 2020-10-03 DIAGNOSIS — S51812D Laceration without foreign body of left forearm, subsequent encounter: Secondary | ICD-10-CM | POA: Insufficient documentation

## 2020-10-03 DIAGNOSIS — I2510 Atherosclerotic heart disease of native coronary artery without angina pectoris: Secondary | ICD-10-CM | POA: Diagnosis not present

## 2020-10-03 DIAGNOSIS — W228XXD Striking against or struck by other objects, subsequent encounter: Secondary | ICD-10-CM | POA: Insufficient documentation

## 2020-10-03 DIAGNOSIS — Z91040 Latex allergy status: Secondary | ICD-10-CM | POA: Insufficient documentation

## 2020-10-03 DIAGNOSIS — Z79899 Other long term (current) drug therapy: Secondary | ICD-10-CM | POA: Diagnosis not present

## 2020-10-03 DIAGNOSIS — Z7982 Long term (current) use of aspirin: Secondary | ICD-10-CM | POA: Diagnosis not present

## 2020-10-03 DIAGNOSIS — Z5189 Encounter for other specified aftercare: Secondary | ICD-10-CM

## 2020-10-03 DIAGNOSIS — Z85828 Personal history of other malignant neoplasm of skin: Secondary | ICD-10-CM | POA: Insufficient documentation

## 2020-10-03 NOTE — Discharge Instructions (Addendum)
Recommend changing dressings daily.  Use small amount of Xeroform or petroleum gauze.  Followed by dry gauze and wrap and roll gauze.  If you develop redness, swelling, fever, pus drainage, come back to ER for reassessment.  Would recommend following up with the wound care specialist to discuss ongoing management and wound care.

## 2020-10-03 NOTE — ED Triage Notes (Signed)
Pt states hit arm on doorframe 3 weeks ago, obtained a skin tear. States has not healed. Redness noted around wound. Denies fever.

## 2020-10-03 NOTE — ED Provider Notes (Signed)
[facility] Provider Note   CSN: [id number] Arrival date & time: 10/03/20  9379     History Chief Complaint  Patient presents with   Wound Check    Holly Briggs is a 86 y.o. female.  Presents to ER for wound check.  Patient states that she has her left arm on the door frame about 3 weeks ago.  Suffered a skin tear.  Did not seek care at the time.  Thinks that it overall has been healing well had some redness few days ago but this seems to have improved.  Has intermittently been bleeding.  Does take Eliquis.  Has schedule an appointment with the wound care center to be seen on Wednesday of this week but had canceled the appointment.  Has not seen her primary doctor about this wound.  No fevers or chills.  HPI     Past Medical History:  Diagnosis Date   Acute bilateral low back pain without sciatica 04/22/2019   Arthritis    Atypical chest pain 05/13/2019   Cancer (HCC)    Basal cell carcinoma   Cancer (HCC)    Squamous cell carcinoma   COPD (chronic obstructive pulmonary disease) (HCC)    Coronary artery disease    History of smoking 05/13/2019   Hyperlipidemia    Lipid disorder    Melanoma ([place])    Numbness and tingling of right leg    Mild residual   Onychomadesis of toenail 04/22/2019   Osteoporosis    Peripheral arterial disease ([place])    Peripheral vascular disease ([place]) 04/22/2019   PONV (postoperative nausea and vomiting)    Rhinitis    Shortness of breath    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   Trochanteric bursitis of left hip 03/26/2019   Wheezing     Patient Active Problem List   Diagnosis Date Noted   Daytime somnolence 10/14/2019   Abnormal ECG 08/27/2019   DOE (dyspnea on exertion) 08/27/2019   Dyslipidemia, goal LDL below 70 08/27/2019   Former smoker 08/27/2019   Dyslipidemia 06/11/2019   Atypical chest pain 05/13/2019   History of smoking 05/13/2019   Peripheral vascular disease ([place])  04/22/2019   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   FEMORAL-POPLITEAL BYPASS GRAFT  03/2017   with Prosthetic for ciritcal lim ischemia, 2/2 thrombosis of existing graft.   Iliofemoral Endarterectomy Right    SKIN BIOPSY     SPINE SURGERY     Fusion, Lumbar   THROMBECTOMY FEMORAL ARTERY  03/2017     OB History   No obstetric history on file.     Family History  Problem Relation Age of Onset   Hyperlipidemia Mother    Pneumonia Father    Hyperlipidemia Sister    Hyperlipidemia Brother    Osteoporosis Sister     Social History   Tobacco Use   Smoking status: Former    Packs/day: 1.00    Years: 50.00    Pack years: 50.00    Types: Cigarettes    Quit date: 03/17/2007  Years since quitting: 13.5   Smokeless tobacco: Never  Vaping Use   Vaping Use: Never used  Substance Use Topics   Alcohol use: Never   Drug use: Never    Home Medications Prior to Admission medications   Medication Sig Start Date End Date Taking? Authorizing Provider  acetaminophen (TYLENOL) 325 MG tablet Take 325 mg by mouth. May take 3 tablets daily as needed for pain    [provider]  apixaban (ELIQUIS) 2.5 MG TABS tablet Take 1 tablet (2.5 mg total) by mouth 2 (two) times daily. 02/18/20   Saguier, Percell Miller, PA-C  aspirin EC 81 MG tablet Take 81 mg by mouth daily.    [provider]  atorvastatin (LIPITOR) 80 MG tablet Take 1 tablet by mouth once daily 05/11/20   Saguier, Percell Miller, PA-C  metoprolol succinate (TOPROL-XL) 50 MG 24 hr tablet Take 1 tablet (50 mg total) by mouth daily. Take with or immediately following a meal.  03/08/20   Debbrah Alar, NP  mirabegron ER (MYRBETRIQ) 25 MG TB24 tablet Take 1 tablet (25 mg total) by mouth daily. 03/28/20   Debbrah Alar, NP    Allergies    Latex, Penicillins, Prednisone, and Tape  Review of Systems   Review of Systems  Constitutional:  Negative for chills and fever.  Skin:  Positive for wound.  All other systems reviewed and are negative.  Physical Exam Updated Vital Signs BP 106/77 (BP Location: Right Arm)   Pulse 94   Temp 98.6 F (37 C) (Oral)   Resp 18   Ht 5\' 2"  (1.575 m)   Wt 54.4 kg   SpO2 98%   BMI 21.95 kg/m   Physical Exam Vitals and nursing note reviewed.  Constitutional:      General: She is not in acute distress.    Appearance: She is well-developed.  HENT:     Head: Normocephalic and atraumatic.  Eyes:     Conjunctiva/sclera: Conjunctivae normal.  Cardiovascular:     Rate and Rhythm: Normal rate.     Pulses: Normal pulses.  Pulmonary:     Effort: Pulmonary effort is normal. No respiratory distress.  Abdominal:     Palpations: Abdomen is soft.     Tenderness: There is no abdominal tenderness.  Musculoskeletal:     Cervical back: Neck supple.  Skin:    General: Skin is warm and dry.     Comments: 3 x 1 cm skin tear over the dorsal aspect of the left forearm, no significant surrounding erythema, no induration, wound appears to be healing well by secondary intention, healthy appearing granulation tissue  Neurological:     Mental Status: She is alert.    ED Results / Procedures / Treatments   Labs (all labs ordered are listed, but only abnormal results are displayed) Labs Reviewed - No data to display  EKG None  Radiology No results found.  Procedures Procedures   Medications Ordered in ED Medications - No data to display  ED Course  I have reviewed the triage vital signs and the nursing notes.  Pertinent labs & imaging results that were available during my care of the patient were reviewed by me and  considered in my medical decision making (see chart for details).    MDM Rules/Calculators/A&P                          86 year old lady presented to the emergency room with concern for wound check.  She suffered a small skin tear on her left forearm 3 weeks ago.  It appears to be healing quite well, good granulation tissue.  No active bleeding, no surrounding erythema to suggest infection.  For today, we will provide a Xeroform gauze dressing followed by dry gauze and rolled gauze. Counseled on further wound management. Recommend that she follow-up with either her primary doctor or the wound care center.  Will discharge home.   After the discussed management above, the patient was determined to be safe for discharge.  The patient was in agreement with this plan and all questions regarding their care were answered.  ED return precautions were discussed and the patient will return to the ED with any significant worsening of condition.    Final Clinical Impression(s) / ED Diagnoses Final diagnoses:  None    Rx / DC Orders ED Discharge Orders     None        Lucrezia Starch, MD 10/04/20 1138

## 2020-10-19 ENCOUNTER — Ambulatory Visit (INDEPENDENT_AMBULATORY_CARE_PROVIDER_SITE_OTHER): Payer: Medicare Other | Admitting: Otolaryngology

## 2020-10-19 ENCOUNTER — Other Ambulatory Visit: Payer: Self-pay

## 2020-10-19 DIAGNOSIS — H6123 Impacted cerumen, bilateral: Secondary | ICD-10-CM

## 2020-10-19 NOTE — Progress Notes (Signed)
HPI: Holly Briggs is a 86 y.o. female who presents for evaluation of her ears.  She has been having some popping and crackling in the right ear and has had problems with wax in the past..  She was last cleaned in January of this year.  Past Medical History:  Diagnosis Date   Acute bilateral low back pain without sciatica 04/22/2019   Arthritis    Atypical chest pain 05/13/2019   Cancer (HCC)    Basal cell carcinoma   Cancer (HCC)    Squamous cell carcinoma   COPD (chronic obstructive pulmonary disease) (HCC)    Coronary artery disease    History of smoking 05/13/2019   Hyperlipidemia    Lipid disorder    Melanoma ([place])    Numbness and tingling of right leg    Mild residual   Onychomadesis of toenail 04/22/2019   Osteoporosis    Peripheral arterial disease ([place])    Peripheral vascular disease ([place]) 04/22/2019   PONV (postoperative nausea and vomiting)    Rhinitis    Shortness of breath    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   FEMORAL-POPLITEAL BYPASS GRAFT  03/2017   with Prosthetic for ciritcal lim ischemia, 2/2 thrombosis of existing graft.   Iliofemoral Endarterectomy Right    SKIN BIOPSY     SPINE SURGERY     Fusion, Lumbar   THROMBECTOMY FEMORAL ARTERY  03/2017   Social History   Socioeconomic History   Marital status: Divorced    Spouse name: Not on file   Number of children: Not on file   Years of education: Not on file   Highest education level: Not on file  Occupational History   Not on file  Tobacco Use   Smoking status: Former    Packs/day: 1.00    Years: 50.00    Pack years: 50.00    Types: Cigarettes    Quit date: 03/17/2007    Years since quitting: 13.6   Smokeless tobacco:  Never  Vaping Use   Vaping Use: Never used  Substance and Sexual Activity   Alcohol use: Never   Drug use: Never   Sexual activity: Not Currently    Birth control/protection: None  Other Topics Concern   Not on file  Social History Narrative   Not on file   Social Determinants of Health   Financial Resource Strain: Not on file  Food Insecurity: Not on file  Transportation Needs: Not on file  Physical Activity: Not on file  Stress: Not on file  Social Connections: Not on file   Family History  Problem Relation Age of Onset   Hyperlipidemia Mother    Pneumonia Father    Hyperlipidemia Sister    Hyperlipidemia Brother    Osteoporosis Sister    Allergies  Allergen Reactions   Latex    Penicillins     "Mouth broke out in sores"   Prednisone Diarrhea   Tape    Prior to Admission medications   Medication Sig Start Date End Date Taking? Authorizing Provider  acetaminophen (TYLENOL) 325 MG tablet Take 325 mg by mouth. May take 3 tablets daily as needed for  pain    [provider]  apixaban (ELIQUIS) 2.5 MG TABS tablet Take 1 tablet (2.5 mg total) by mouth 2 (two) times daily. 02/18/20   Saguier, Percell Miller, PA-C  aspirin EC 81 MG tablet Take 81 mg by mouth daily.    [provider]  atorvastatin (LIPITOR) 80 MG tablet Take 1 tablet by mouth once daily 05/11/20   Saguier, Percell Miller, PA-C  metoprolol succinate (TOPROL-XL) 50 MG 24 hr tablet Take 1 tablet (50 mg total) by mouth daily. Take with or immediately following a meal. 03/08/20   Debbrah Alar, NP  mirabegron ER (MYRBETRIQ) 25 MG TB24 tablet Take 1 tablet (25 mg total) by mouth daily. 03/28/20   Debbrah Alar, NP     Positive ROS: Otherwise negative  All other systems have been reviewed and were otherwise negative with the exception of those mentioned in the HPI and as above.  Physical Exam: Constitutional: Alert, well-appearing, no acute distress Ears: External ears without lesions or  tenderness. Ear canals with minimal wax buildup in both ear canals that was cleaned with curette and forceps.  This was nonobstructing.  TMs were clear bilaterally.. Nasal: External nose without lesions. Clear nasal passages Oral: Oropharynx clear. Neck: No palpable adenopathy or masses Respiratory: Breathing comfortably  Skin: No facial/neck lesions or rash noted.  Cerumen impaction removal  Date/Time: 10/19/2020 11:01 AM Performed by: Rozetta Nunnery, MD Authorized by: Rozetta Nunnery, MD   Consent:    Consent obtained:  Verbal   Consent given by:  Patient   Risks discussed:  Pain and bleeding Procedure details:    Location:  L ear and R ear   Procedure type: curette and forceps   Post-procedure details:    Inspection:  TM intact and canal normal   Hearing quality:  Improved   Procedure completion:  Tolerated well, no immediate complications Comments:     Minimal wax buildup.  TMs are clear bilaterally.  Assessment: Minimal wax buildup on exam today.  Plan: Ears were cleaned in the office.  She will follow-up as needed.  Radene Journey, MD

## 2020-11-08 ENCOUNTER — Emergency Department (HOSPITAL_BASED_OUTPATIENT_CLINIC_OR_DEPARTMENT_OTHER)
Admission: EM | Admit: 2020-11-08 | Discharge: 2020-11-08 | Disposition: A | Discharge status: Home / Self Care | Payer: Medicare Other | Attending: Emergency Medicine | Admitting: Emergency Medicine

## 2020-11-08 ENCOUNTER — Other Ambulatory Visit: Payer: Self-pay

## 2020-11-08 ENCOUNTER — Encounter (HOSPITAL_BASED_OUTPATIENT_CLINIC_OR_DEPARTMENT_OTHER): Payer: Self-pay | Admitting: Emergency Medicine

## 2020-11-08 ENCOUNTER — Other Ambulatory Visit (HOSPITAL_BASED_OUTPATIENT_CLINIC_OR_DEPARTMENT_OTHER): Payer: Self-pay

## 2020-11-08 DIAGNOSIS — Z7901 Long term (current) use of anticoagulants: Secondary | ICD-10-CM | POA: Diagnosis not present

## 2020-11-08 DIAGNOSIS — Z23 Encounter for immunization: Secondary | ICD-10-CM | POA: Insufficient documentation

## 2020-11-08 DIAGNOSIS — S81832A Puncture wound without foreign body, left lower leg, initial encounter: Secondary | ICD-10-CM | POA: Insufficient documentation

## 2020-11-08 DIAGNOSIS — Z7982 Long term (current) use of aspirin: Secondary | ICD-10-CM | POA: Diagnosis not present

## 2020-11-08 DIAGNOSIS — J449 Chronic obstructive pulmonary disease, unspecified: Secondary | ICD-10-CM | POA: Insufficient documentation

## 2020-11-08 DIAGNOSIS — Z79899 Other long term (current) drug therapy: Secondary | ICD-10-CM | POA: Diagnosis not present

## 2020-11-08 DIAGNOSIS — Z91040 Latex allergy status: Secondary | ICD-10-CM | POA: Insufficient documentation

## 2020-11-08 DIAGNOSIS — Z85820 Personal history of malignant melanoma of skin: Secondary | ICD-10-CM | POA: Diagnosis not present

## 2020-11-08 DIAGNOSIS — I2510 Atherosclerotic heart disease of native coronary artery without angina pectoris: Secondary | ICD-10-CM | POA: Insufficient documentation

## 2020-11-08 DIAGNOSIS — Z85828 Personal history of other malignant neoplasm of skin: Secondary | ICD-10-CM | POA: Insufficient documentation

## 2020-11-08 DIAGNOSIS — W5501XA Bitten by cat, initial encounter: Secondary | ICD-10-CM | POA: Insufficient documentation

## 2020-11-08 DIAGNOSIS — S81852A Open bite, left lower leg, initial encounter: Secondary | ICD-10-CM | POA: Diagnosis present

## 2020-11-08 MED ORDER — LEVOFLOXACIN 750 MG PO TABS
750.0000 mg | ORAL_TABLET | Freq: Every day | ORAL | 0 refills | Status: AC
  Filled 2020-11-08: qty 10, 10d supply, fill #0

## 2020-11-08 MED ORDER — CLINDAMYCIN HCL 300 MG PO CAPS
300.0000 mg | ORAL_CAPSULE | Freq: Three times a day (TID) | ORAL | 0 refills | Status: AC
  Filled 2020-11-08: qty 30, 10d supply, fill #0

## 2020-11-08 MED ORDER — TETANUS-DIPHTH-ACELL PERTUSSIS 5-2.5-18.5 LF-MCG/0.5 IM SUSY
0.5000 mL | PREFILLED_SYRINGE | Freq: Once | INTRAMUSCULAR | Status: AC
  Administered 2020-11-08: 0.5 mL via INTRAMUSCULAR
  Filled 2020-11-08: qty 0.5

## 2020-11-08 NOTE — ED Provider Notes (Signed)
[facility] EMERGENCY DEPARTMENT Provider Note   CSN: [id number] Arrival date & time: 11/08/20  [id number]     History Chief Complaint  Patient presents with   Leg Injury    Holly Briggs is a 86 y.o. female.  Patient states scratched by her cat yesterday on her left shin.  Does not think that she got biTE.  Animal shots are up-to-date.  Her tetanus shot she is not sure that it is updated.  The history is provided by the patient.  Animal Bite Contact animal:  Cat Time since incident:  1 day Pain details:    Quality:  Aching   Severity:  Mild Tetanus status:  Up to date Relieved by:  Nothing Worsened by:  Nothing Associated symptoms: swelling   Associated symptoms: no fever, no numbness and no rash       Past Medical History:  Diagnosis Date   Acute bilateral low back pain without sciatica 04/22/2019   Arthritis    Atypical chest pain 05/13/2019   Cancer ([place])    Basal cell carcinoma   Cancer (HCC)    Squamous cell carcinoma   COPD (chronic obstructive pulmonary disease) ([place])    Coronary artery disease    History of smoking 05/13/2019   Hyperlipidemia    Lipid disorder    Melanoma ([place])    Numbness and tingling of right leg    Mild residual   Onychomadesis of toenail 04/22/2019   Osteoporosis    Peripheral arterial disease (HCC)    Peripheral vascular disease ([place]) 04/22/2019   PONV (postoperative nausea and vomiting)    Rhinitis    Shortness of breath    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   Trochanteric bursitis of left hip 03/26/2019   Wheezing     Patient Active Problem List   Diagnosis Date Noted   Daytime somnolence 10/14/2019   Abnormal ECG 08/27/2019   DOE (dyspnea on exertion) 08/27/2019   Dyslipidemia, goal LDL below 70 08/27/2019   Former smoker 08/27/2019   Dyslipidemia 06/11/2019   Atypical chest pain 05/13/2019   History of smoking 05/13/2019   Peripheral vascular disease ([place]) 04/22/2019   Squamous cell  carcinoma in situ (SCCIS) of skin 04/22/2019   Acute bilateral low back pain without sciatica 04/22/2019   Onychomadesis of toenail 04/22/2019   Trochanteric bursitis of left hip 03/26/2019    Past Surgical History:  Procedure Laterality Date   ANGIOPLASTY     Arteriography     COLONOSCOPY     EYE SURGERY     Laser vision correction   FASCIOTOMY  03/2017   4 compartment.   FEMORAL-POPLITEAL BYPASS GRAFT  03/2017   with Prosthetic for ciritcal lim ischemia, 2/2 thrombosis of existing graft.   Iliofemoral Endarterectomy Right    SKIN BIOPSY     SPINE SURGERY     Fusion, Lumbar   THROMBECTOMY FEMORAL ARTERY  03/2017     OB History   No obstetric history on file.     Family History  Problem Relation Age of Onset   Hyperlipidemia Mother    Pneumonia Father    Hyperlipidemia Sister    Hyperlipidemia Brother    Osteoporosis Sister     Social History   Tobacco Use   Smoking status: Former    Packs/day: 1.00    Years: 50.00    Pack years: 50.00    Types: Cigarettes    Quit date: 03/17/2007    Years since quitting:  13.6   Smokeless tobacco: Never  Vaping Use   Vaping Use: Never used  Substance Use Topics   Alcohol use: Never   Drug use: Never    Home Medications Prior to Admission medications   Medication Sig Start Date End Date Taking? Authorizing Provider  clindamycin (CLEOCIN) 300 MG capsule Take 1 capsule (300 mg total) by mouth 3 (three) times daily for 10 days. 11/08/20 11/18/20 Yes Montarius Kitagawa, DO  levofloxacin (LEVAQUIN) 750 MG tablet Take 1 tablet (750 mg total) by mouth daily for 10 days. 11/08/20 11/18/20 Yes Lawson Isabell, DO  acetaminophen (TYLENOL) 325 MG tablet Take 325 mg by mouth. May take 3 tablets daily as needed for pain    [provider]  apixaban (ELIQUIS) 2.5 MG TABS tablet Take 1 tablet (2.5 mg total) by mouth 2 (two) times daily. 02/18/20   Saguier, Percell Miller, PA-C  aspirin EC 81 MG tablet Take 81 mg by mouth daily.    [provider]  atorvastatin (LIPITOR) 80 MG tablet Take 1 tablet by mouth once daily 05/11/20   Saguier, Percell Miller, PA-C  metoprolol succinate (TOPROL-XL) 50 MG 24 hr tablet Take 1 tablet (50 mg total) by mouth daily. Take with or immediately following a meal. 03/08/20   Debbrah Alar, NP  mirabegron ER (MYRBETRIQ) 25 MG TB24 tablet Take 1 tablet (25 mg total) by mouth daily. 03/28/20   Debbrah Alar, NP    Allergies    Latex, Penicillins, Prednisone, and Tape  Review of Systems   Review of Systems  Constitutional:  Negative for fever.  Skin:  Positive for wound. Negative for color change and rash.  Neurological:  Negative for weakness and numbness.   Physical Exam Updated Vital Signs BP (!) 137/100 (BP Location: Right Arm)   Pulse 95   Temp 98.4 F (36.9 C) (Oral)   Resp 20   Ht '5\' 1"'$  (1.549 m)   Wt 54.4 kg   SpO2 98%   BMI 22.67 kg/m   Physical Exam Musculoskeletal:        General: No tenderness. Normal range of motion.  Skin:    General: Skin is warm.     Findings: Erythema present.     Comments: To puncture wounds/scratches to her left shin with some mild erythema but no fluctuance  Neurological:     Mental Status: She is alert.     Sensory: No sensory deficit.     Motor: No weakness.    ED Results / Procedures / Treatments   Labs (all labs ordered are listed, but only abnormal results are displayed) Labs Reviewed - No data to display  EKG None  Radiology No results found.  Procedures Procedures   Medications Ordered in ED Medications  Tdap (BOOSTRIX) injection 0.5 mL (has no administration in time range)    ED Course  I have reviewed the triage vital signs and the nursing notes.  Pertinent labs & imaging results that were available during my care of the patient were reviewed by me and considered in my medical decision making (see chart for details).    MDM Rules/Calculators/A&P                           Holly Briggs is  here after getting scratched/may be bit by her cat yesterday.  Tetanus shot updated.  Animals shots are updated.  Will prescribe levofloxacin and clindamycin due to penicillin allergy.  Wound care provided.  Wound instructions  given.  Return precautions given.  Discharged in good condition.  This chart was dictated using voice recognition software.  Despite best efforts to proofread,  errors can occur which can change the documentation meaning.  Final Clinical Impression(s) / ED Diagnoses Final diagnoses:  Cat bite, initial encounter    Rx / DC Orders ED Discharge Orders          Ordered    levofloxacin (LEVAQUIN) 750 MG tablet  Daily        11/08/20 0816    clindamycin (CLEOCIN) 300 MG capsule  3 times daily        11/08/20 0816             Lennice Sites, DO 11/08/20 0818

## 2020-11-08 NOTE — ED Triage Notes (Signed)
Patient sustained cat scratch to LLE last night. Feels it is infected.

## 2020-11-09 ENCOUNTER — Other Ambulatory Visit (HOSPITAL_BASED_OUTPATIENT_CLINIC_OR_DEPARTMENT_OTHER): Payer: Self-pay

## 2020-12-22 ENCOUNTER — Other Ambulatory Visit: Payer: Self-pay

## 2020-12-22 DIAGNOSIS — I739 Peripheral vascular disease, unspecified: Secondary | ICD-10-CM

## 2021-01-16 ENCOUNTER — Other Ambulatory Visit: Payer: Self-pay | Admitting: Medical

## 2021-01-16 DIAGNOSIS — I739 Peripheral vascular disease, unspecified: Secondary | ICD-10-CM

## 2021-05-31 ENCOUNTER — Ambulatory Visit (INDEPENDENT_AMBULATORY_CARE_PROVIDER_SITE_OTHER): Payer: Medicare Other | Admitting: Vascular Surgery

## 2021-05-31 ENCOUNTER — Ambulatory Visit (HOSPITAL_COMMUNITY)
Admission: RE | Admit: 2021-05-31 | Discharge: 2021-05-31 | Disposition: A | Discharge status: Home / Self Care | Payer: Medicare Other | Source: Ambulatory Visit | Attending: Vascular Surgery | Admitting: Vascular Surgery

## 2021-05-31 ENCOUNTER — Encounter: Payer: Self-pay | Admitting: Vascular Surgery

## 2021-05-31 ENCOUNTER — Other Ambulatory Visit: Payer: Self-pay

## 2021-05-31 ENCOUNTER — Ambulatory Visit (INDEPENDENT_AMBULATORY_CARE_PROVIDER_SITE_OTHER)
Admission: RE | Admit: 2021-05-31 | Discharge: 2021-05-31 | Disposition: A | Discharge status: Home / Self Care | Payer: Medicare Other | Source: Ambulatory Visit | Attending: Vascular Surgery | Admitting: Vascular Surgery

## 2021-05-31 VITALS — BP 163/81 | HR 75 | Temp 97.7°F | Resp 20 | Ht 61.0 in | Wt 118.0 lb

## 2021-05-31 DIAGNOSIS — I739 Peripheral vascular disease, unspecified: Secondary | ICD-10-CM

## 2021-05-31 NOTE — Progress Notes (Signed)
? ?Patient ID: Holly Briggs, female   DOB: 1935/01/24, 87 y.o.   MRN: [id number] ? ?Reason for Consult: Follow-up ?  ?Referred by No ref. provider found ? ?Subjective:  ?   ?HPI: ? ?Holly Briggs is a 87 y.o. female with history of femoral-popliteal bypass originally in 2010 and subsequently underwent multiple additional procedures all of which were in [place].  Her last procedure she had 4 compartment fasciotomy.  She then moved to [facility] was initially seen by Dr. Donnetta Hutching and subsequently by Leontine Locket last visit.  She continues on Eliquis, aspirin and statin.  She does not have any leg problems at this time and is able to walk as far she needs. ? ?Past Medical History:  ?Diagnosis Date  ? Acute bilateral low back pain without sciatica 04/22/2019  ? Arthritis   ? Atypical chest pain 05/13/2019  ? Cancer [facility])   ? Basal cell carcinoma  ? Cancer [facility])   ? Squamous cell carcinoma  ? COPD (chronic obstructive pulmonary disease) ([place])   ? Coronary artery disease   ? History of smoking 05/13/2019  ? Hyperlipidemia   ? Lipid disorder   ? Melanoma ([place])   ? Numbness and tingling of right leg   ? Mild residual  ? Onychomadesis of toenail 04/22/2019  ? Osteoporosis   ? Peripheral arterial disease ([place])   ? Peripheral vascular disease ([place]) 04/22/2019  ? PONV (postoperative nausea and vomiting)   ? Rhinitis   ? Shortness of breath   ? 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  ? Trochanteric bursitis of left hip 03/26/2019  ? Wheezing   ? ?Family History  ?Problem Relation Age of Onset  ? Hyperlipidemia Mother   ? Pneumonia Father   ? Hyperlipidemia Sister   ? Hyperlipidemia Brother   ? Osteoporosis Sister   ? ?Past Surgical History:  ?Procedure Laterality Date  ? ANGIOPLASTY    ? Arteriography    ? COLONOSCOPY    ? EYE SURGERY    ? Laser vision correction  ? FASCIOTOMY  03/2017  ? 4 compartment.  ? FEMORAL-POPLITEAL BYPASS GRAFT  03/2017  ? with Prosthetic for ciritcal  lim ischemia, 2/2 thrombosis of existing graft.  ? Iliofemoral Endarterectomy Right   ? SKIN BIOPSY    ? SPINE SURGERY    ? Fusion, Lumbar  ? THROMBECTOMY FEMORAL ARTERY  03/2017  ? ? ?Short Social History:  ?Social History  ? ?Tobacco Use  ? Smoking status: Former  ?  Packs/day: 1.00  ?  Years: 50.00  ?  Pack years: 50.00  ?  Types: Cigarettes  ?  Quit date: 03/17/2007  ?  Years since quitting: 14.2  ? Smokeless tobacco: Never  ?Substance Use Topics  ? Alcohol use: Never  ? ? ?Allergies  ?Allergen Reactions  ? Latex   ? Penicillins   ?  "Mouth broke out in sores"  ? Prednisone Diarrhea  ? Tape   ? Levofloxacin Anxiety  ? ? ?Current Outpatient Medications  ?Medication Sig Dispense Refill  ? acetaminophen (TYLENOL) 325 MG tablet Take 325 mg by mouth. May take 3 tablets daily as needed for pain    ? aspirin EC 81 MG tablet Take 81 mg by mouth daily.    ? atorvastatin (LIPITOR) 80 MG tablet Take 1 tablet by mouth once daily 90 tablet 0  ? ELIQUIS 2.5 MG TABS tablet Take 1 tablet by mouth twice daily 60 tablet 0  ? metoprolol  succinate (TOPROL-XL) 50 MG 24 hr tablet Take 1 tablet (50 mg total) by mouth daily. Take with or immediately following a meal. 50 tablet 3  ? mirabegron ER (MYRBETRIQ) 25 MG TB24 tablet Take 1 tablet (25 mg total) by mouth daily. 30 tablet 2  ? ?No current facility-administered medications for this visit.  ? ? ?Review of Systems  ?Constitutional:  Constitutional negative. ?HENT: HENT negative.  ?Eyes: Eyes negative.  ?Respiratory: Respiratory negative.  ?Cardiovascular: Cardiovascular negative.  ?GI: Gastrointestinal negative.  ?Musculoskeletal: Musculoskeletal negative.  ?Skin: Skin negative.  ?Neurological: Neurological negative. ?Hematologic: Hematologic/lymphatic negative.  ?Psychiatric: Psychiatric negative.   ? ?   ?Objective:  ?Objective  ? ?Vitals:  ? 05/31/21 1032  ?BP: (!) 163/81  ?Pulse: 75  ?Resp: 20  ?Temp: 97.7 ?F (36.5 ?C)  ?SpO2: 98%  ?Weight: 118 lb (53.5 kg)  ?Height: '5\' 1"'$   (1.549 m)  ? ?Body mass index is 22.3 kg/m?. ? ?Physical Exam ?HENT:  ?   Head: Normocephalic.  ?   Nose:  ?   Comments: Wearing a mask ?Eyes:  ?   Pupils: Pupils are equal, round, and reactive to light.  ?Neck:  ?   Vascular: No carotid bruit.  ?Cardiovascular:  ?   Rate and Rhythm: Normal rate and regular rhythm.  ?Pulmonary:  ?   Effort: Pulmonary effort is normal.  ?   Breath sounds: Normal breath sounds.  ?Abdominal:  ?   General: Abdomen is flat.  ?   Palpations: Abdomen is soft.  ?Musculoskeletal:     ?   General: Normal range of motion.  ?   Cervical back: Normal range of motion.  ?   Right lower leg: No edema.  ?   Left lower leg: No edema.  ?   Comments: Well-healed right lower extremity fasciotomy sites  ?Skin: ?   General: Skin is warm and dry.  ?   Capillary Refill: Capillary refill takes less than 2 seconds.  ?Neurological:  ?   General: No focal deficit present.  ?   Mental Status: She is alert.  ?Psychiatric:     ?   Mood and Affect: Mood normal.     ?   Thought Content: Thought content normal.  ? ? ?Data: ?ABI Findings:  ?+---------+------------------+-----+---------+--------+  ?Right    Rt Pressure (mmHg)IndexWaveform Comment   ?+---------+------------------+-----+---------+--------+  ?Brachial 137                                       ?+---------+------------------+-----+---------+--------+  ?PTA      150               1.09 triphasic          ?+---------+------------------+-----+---------+--------+  ?DP       154               1.12 triphasic          ?+---------+------------------+-----+---------+--------+  ?Great Toe81                0.59                    ?+---------+------------------+-----+---------+--------+  ? ?+---------+------------------+-----+---------+-------+  ?Left     Lt Pressure (mmHg)IndexWaveform Comment  ?+---------+------------------+-----+---------+-------+  ?Brachial 128                                       ?+---------+------------------+-----+---------+-------+  ?  PTA      143               1.04 triphasic         ?+---------+------------------+-----+---------+-------+  ?DP       141               1.03 triphasic         ?+---------+------------------+-----+---------+-------+  ?Great Toe72                0.53                   ?+---------+------------------+-----+---------+-------+  ? ?+-------+-----------+-----------+------------+------------+  ?ABI/TBIToday's ABIToday's TBIPrevious ABIPrevious TBI  ?+-------+-----------+-----------+------------+------------+  ?Right  1.12       0.59       1.06        0.42          ?+-------+-----------+-----------+------------+------------+  ?Left   1.04       0.53       0.96        0.31          ?+-------+-----------+-----------+------------+------------+  ? ?Right Graft #1: Femoral-popliteal  ?+------------------+--------+--------+--------+--------+  ?                  PSV cm/sStenosisWaveformComments  ?+------------------+--------+--------+--------+--------+  ?Inflow            96              biphasic          ?+------------------+--------+--------+--------+--------+  ?Prox Anastomosis  147             biphasic          ?+------------------+--------+--------+--------+--------+  ?Proximal Graft    63              biphasic          ?+------------------+--------+--------+--------+--------+  ?Mid Graft         47              biphasic          ?+------------------+--------+--------+--------+--------+  ?Distal Graft      48              biphasic          ?+------------------+--------+--------+--------+--------+  ?Distal Anastomosis50              biphasic          ?+------------------+--------+--------+--------+--------+  ?Outflow           35              biphasic          ?+------------------+--------+--------+--------+--------+  ? ?   ? ?   ? ?   ?Summary:  ?Right: Two femoral-popliteal bypass grafts  identified. One is patent with  ?no evidence of stenosis, and one is occluded.  ?    ?Assessment/Plan:  ?  ?87 year old female with history of multiple previous bypasses in [place] the current bypass is patent and has been for the past couple years without any issues.  She will follow-up in 1 year with repeat duplex. ? ?  ? ?Malachi Paradise

## 2021-06-24 ENCOUNTER — Encounter (HOSPITAL_BASED_OUTPATIENT_CLINIC_OR_DEPARTMENT_OTHER): Payer: Self-pay | Admitting: Emergency Medicine

## 2021-06-24 ENCOUNTER — Telehealth (HOSPITAL_BASED_OUTPATIENT_CLINIC_OR_DEPARTMENT_OTHER): Payer: Self-pay | Admitting: Emergency Medicine

## 2021-06-24 ENCOUNTER — Emergency Department (HOSPITAL_BASED_OUTPATIENT_CLINIC_OR_DEPARTMENT_OTHER): Payer: Medicare Other

## 2021-06-24 ENCOUNTER — Emergency Department (HOSPITAL_BASED_OUTPATIENT_CLINIC_OR_DEPARTMENT_OTHER)
Admission: EM | Admit: 2021-06-24 | Discharge: 2021-06-24 | Disposition: A | Discharge status: Home / Self Care | Payer: Medicare Other | Attending: Emergency Medicine | Admitting: Emergency Medicine

## 2021-06-24 ENCOUNTER — Other Ambulatory Visit: Payer: Self-pay

## 2021-06-24 DIAGNOSIS — Z7982 Long term (current) use of aspirin: Secondary | ICD-10-CM | POA: Diagnosis not present

## 2021-06-24 DIAGNOSIS — Z91040 Latex allergy status: Secondary | ICD-10-CM | POA: Insufficient documentation

## 2021-06-24 DIAGNOSIS — Z7901 Long term (current) use of anticoagulants: Secondary | ICD-10-CM | POA: Diagnosis not present

## 2021-06-24 DIAGNOSIS — J449 Chronic obstructive pulmonary disease, unspecified: Secondary | ICD-10-CM | POA: Diagnosis not present

## 2021-06-24 DIAGNOSIS — R000 Tachycardia, unspecified: Secondary | ICD-10-CM | POA: Insufficient documentation

## 2021-06-24 DIAGNOSIS — K5732 Diverticulitis of large intestine without perforation or abscess without bleeding: Secondary | ICD-10-CM | POA: Insufficient documentation

## 2021-06-24 DIAGNOSIS — R109 Unspecified abdominal pain: Secondary | ICD-10-CM | POA: Diagnosis present

## 2021-06-24 DIAGNOSIS — I2510 Atherosclerotic heart disease of native coronary artery without angina pectoris: Secondary | ICD-10-CM | POA: Insufficient documentation

## 2021-06-24 DIAGNOSIS — K5792 Diverticulitis of intestine, part unspecified, without perforation or abscess without bleeding: Secondary | ICD-10-CM

## 2021-06-24 LAB — COMPREHENSIVE METABOLIC PANEL
ALT: 11 U/L (ref 0–44)
AST: 18 U/L (ref 15–41)
Albumin: 3.5 g/dL (ref 3.5–5.0)
Alkaline Phosphatase: 60 U/L (ref 38–126)
Anion gap: 8 (ref 5–15)
BUN: 17 mg/dL (ref 8–23)
CO2: 25 mmol/L (ref 22–32)
Calcium: 8.9 mg/dL (ref 8.9–10.3)
Chloride: 107 mmol/L (ref 98–111)
Creatinine, Ser: 1.05 mg/dL — ABNORMAL HIGH (ref 0.44–1.00)
GFR, Estimated: 52 mL/min — ABNORMAL LOW (ref >60)
Glucose, Bld: 109 mg/dL — ABNORMAL HIGH (ref 70–99)
Potassium: 4.5 mmol/L (ref 3.5–5.1)
Sodium: 140 mmol/L (ref 135–145)
Total Bilirubin: 0.7 mg/dL (ref 0.3–1.2)
Total Protein: 6.9 g/dL (ref 6.5–8.1)

## 2021-06-24 LAB — CBC WITH DIFFERENTIAL/PLATELET
Abs Immature Granulocytes: 0.02 10*3/uL (ref 0.00–0.07)
Basophils Absolute: 0.1 10*3/uL (ref 0.0–0.1)
Basophils Relative: 1 %
Eosinophils Absolute: 0.1 10*3/uL (ref 0.0–0.5)
Eosinophils Relative: 2 %
HCT: 37.5 % (ref 36.0–46.0)
Hemoglobin: 12.2 g/dL (ref 12.0–15.0)
Immature Granulocytes: 0 %
Lymphocytes Relative: 11 %
Lymphs Abs: 0.7 10*3/uL (ref 0.7–4.0)
MCH: 29.6 pg (ref 26.0–34.0)
MCHC: 32.5 g/dL (ref 30.0–36.0)
MCV: 91 fL (ref 80.0–100.0)
Monocytes Absolute: 0.5 10*3/uL (ref 0.1–1.0)
Monocytes Relative: 8 %
Neutro Abs: 5.4 10*3/uL (ref 1.7–7.7)
Neutrophils Relative %: 78 %
Platelets: 287 10*3/uL (ref 150–400)
RBC: 4.12 MIL/uL (ref 3.87–5.11)
RDW: 12.9 % (ref 11.5–15.5)
WBC: 6.8 10*3/uL (ref 4.0–10.5)
nRBC: 0 % (ref 0.0–0.2)

## 2021-06-24 MED ORDER — AMOXICILLIN-POT CLAVULANATE 875-125 MG PO TABS: 1.0000 | ORAL_TABLET | Freq: Two times a day (BID) | ORAL | 0 refills | Status: AC

## 2021-06-24 MED ORDER — ACETAMINOPHEN 325 MG PO TABS
650.0000 mg | ORAL_TABLET | Freq: Once | ORAL | Status: DC
Start: 2021-06-24 — End: 2021-06-24
  Filled 2021-06-24: qty 2

## 2021-06-24 MED ORDER — IOHEXOL 300 MG/ML  SOLN
100.0000 mL | Freq: Once | INTRAMUSCULAR | Status: AC | PRN
Start: 2021-06-24 — End: 2021-06-24
  Administered 2021-06-24: 100 mL via INTRAVENOUS

## 2021-06-24 MED ORDER — AMOXICILLIN-POT CLAVULANATE 875-125 MG PO TABS
1.0000 | ORAL_TABLET | Freq: Once | ORAL | Status: AC
Start: 2021-06-24 — End: 2021-06-24
  Administered 2021-06-24: 1 via ORAL
  Filled 2021-06-24: qty 1

## 2021-06-24 NOTE — ED Notes (Signed)
Patient transported to CT 

## 2021-06-24 NOTE — ED Provider Notes (Signed)
?[facility] EMERGENCY DEPARTMENT ?Provider Note ? ? ?CSN: [id number] ?Arrival date & time: 06/24/21  0715 ? ?  ? ?History ? ?Chief Complaint  ?Patient presents with  ? Abdominal Pain  ? ? ?Holly Briggs is a 87 y.o. female. ? ?Patient is an 87 year old female with multiple medical problems including peripheral artery disease status post femoropopliteal bypass with revision required, COPD, CAD, hyperlipidemia, osteoporosis and chronic back pain who is presenting today with complaints of abdominal pain that has been worsening over the last 10 days.  Patient reports the pain started a day after she had a very stressful and emotional encounter with her sister.  She reports that her sister said some very hurtful things to her and the next day she woke up and has had this migrating abdominal pain, decreased oral intake and frequent stools for the last 10 days.  She reports the pain is gradually getting worse but it seems to be in various places in her abdomen and does not stay in just one place.  She reports having 3-4 bowel movements per day which are 80% solid.  She has not had any nausea or vomiting and reports she is eating but not a lot.  Sometimes eating makes the pain worse but sometimes it does not.  Movement definitely seems to make the pain worse.  The pain does not radiate into her back or chest.  She has not had new shortness of breath or fever.  She has not had any issues with her legs.  She denies prior history of abdominal pain.  She denies any urinary symptoms.  No prior abdominal surgeries.  She has tried taking Pepto-Bismol and over-the-counter antidiarrheal medicines but reports that has not helped her symptoms. ? ?The history is provided by the patient.  ?Abdominal Pain ? ?  ? ?Home Medications ?Prior to Admission medications   ?Medication Sig Start Date End Date Taking? Authorizing Provider  ?amoxicillin-clavulanate (AUGMENTIN) 875-125 MG tablet Take 1 tablet by mouth every 12  (twelve) hours. 06/24/21  Yes Blanchie Dessert, MD  ?acetaminophen (TYLENOL) 325 MG tablet Take 325 mg by mouth. May take 3 tablets daily as needed for pain    [provider]  ?aspirin EC 81 MG tablet Take 81 mg by mouth daily.    [provider]  ?atorvastatin (LIPITOR) 80 MG tablet Take 1 tablet by mouth once daily 05/11/20   Saguier, Percell Miller, PA-C  ?ELIQUIS 2.5 MG TABS tablet Take 1 tablet by mouth twice daily 01/16/21   Saguier, Percell Miller, PA-C  ?metoprolol succinate (TOPROL-XL) 50 MG 24 hr tablet Take 1 tablet (50 mg total) by mouth daily. Take with or immediately following a meal. 03/08/20   Debbrah Alar, NP  ?mirabegron ER (MYRBETRIQ) 25 MG TB24 tablet Take 1 tablet (25 mg total) by mouth daily. 03/28/20   Debbrah Alar, NP  ?   ? ?Allergies    ?Latex, Penicillins, Prednisone, Tape, and Levofloxacin   ? ?Review of Systems   ?Review of Systems  ?Gastrointestinal:  Positive for abdominal pain.  ? ?Physical Exam ?Updated Vital Signs ?BP (!) 186/102   Pulse (!) 111   Temp 98.4 ?F (36.9 ?C) (Oral)   Resp 16   SpO2 100%  ?Physical Exam ?Vitals and nursing note reviewed.  ?Constitutional:   ?   General: She is not in acute distress. ?   Appearance: She is well-developed.  ?   Comments: When patient moves she appears to be in pain  ?HENT:  ?  Head: Normocephalic and atraumatic.  ?Eyes:  ?   Pupils: Pupils are equal, round, and reactive to light.  ?Cardiovascular:  ?   Rate and Rhythm: Regular rhythm. Tachycardia present.  ?   Pulses: Normal pulses.  ?   Heart sounds: Normal heart sounds. No murmur heard. ?  No friction rub.  ?Pulmonary:  ?   Effort: Pulmonary effort is normal.  ?   Breath sounds: Normal breath sounds. No wheezing or rales.  ?Abdominal:  ?   General: Bowel sounds are normal. There is no distension.  ?   Palpations: Abdomen is soft.  ?   Tenderness: There is generalized abdominal tenderness. There is guarding. There is no rebound.  ?   Hernia: No hernia is present.   ?Musculoskeletal:     ?   General: No tenderness. Normal range of motion.  ?   Right lower leg: No edema.  ?   Left lower leg: No edema.  ?   Comments: No edema  ?Skin: ?   General: Skin is warm and dry.  ?   Capillary Refill: Capillary refill takes less than 2 seconds.  ?   Findings: No rash.  ?Neurological:  ?   Mental Status: She is alert and oriented to person, place, and time. Mental status is at baseline.  ?   Cranial Nerves: No cranial nerve deficit.  ?Psychiatric:     ?   Mood and Affect: Mood normal.     ?   Behavior: Behavior normal.  ? ? ?ED Results / Procedures / Treatments   ?Labs ?(all labs ordered are listed, but only abnormal results are displayed) ?Labs Reviewed  ?COMPREHENSIVE METABOLIC PANEL - Abnormal; Notable for the following components:  ?    Result Value  ? Glucose, Bld 109 (*)   ? Creatinine, Ser 1.05 (*)   ? GFR, Estimated 52 (*)   ? All other components within normal limits  ?CBC WITH DIFFERENTIAL/PLATELET  ?URINALYSIS, ROUTINE W REFLEX MICROSCOPIC  ? ? ?EKG ?None ? ?Radiology ?CT ABDOMEN PELVIS W CONTRAST ? ?Result Date: 06/24/2021 ?CLINICAL DATA:  87 year old female with history of generalized abdominal pain for the past 10 days. EXAM: CT ABDOMEN AND PELVIS WITH CONTRAST TECHNIQUE: Multidetector CT imaging of the abdomen and pelvis was performed using the standard protocol following bolus administration of intravenous contrast. RADIATION DOSE REDUCTION: This exam was performed according to the departmental dose-optimization program which includes automated exposure control, adjustment of the mA and/or kV according to patient size and/or use of iterative reconstruction technique. CONTRAST:  126m OMNIPAQUE IOHEXOL 300 MG/ML  SOLN COMPARISON:  CT the abdomen and pelvis 07/26/2020. FINDINGS: Lower chest: Atherosclerotic calcifications in the descending thoracic aorta. Severe calcifications of the mitral annulus. Moderate hiatal hernia. Hepatobiliary: No suspicious cystic or solid hepatic  lesions. No intra or extrahepatic biliary ductal dilatation. Gallbladder is normal in appearance. Pancreas: No pancreatic mass. No pancreatic ductal dilatation. No pancreatic or peripancreatic fluid collections or inflammatory changes. Spleen: Unremarkable. Adrenals/Urinary Tract: Subcentimeter low-attenuation lesion in the lower pole of the right kidney, too small to characterize, but statistically likely to represent a cyst. 1.6 cm low-attenuation lesion in the upper pole of the left kidney is compatible with a simple cyst. Bilateral adrenal glands are normal in appearance. No hydroureteronephrosis. Urinary bladder is normal in appearance. Stomach/Bowel: Intra-abdominal portion of the stomach is normal. No pathologic dilatation of small bowel or colon. Numerous colonic diverticulae are noted, particularly in the sigmoid colon where there is extensive mural thickening  and there are some subtle surrounding inflammatory changes suggesting mild acute diverticulitis. No discrete diverticular abscess or signs of frank perforation or confidently identified at this time. Normal appendix. Vascular/Lymphatic: Aortic atherosclerosis, without evidence of aneurysm or dissection in the abdominal or pelvic vasculature. No lymphadenopathy noted in the abdomen or pelvis. Reproductive: Uterus and ovaries are atrophic. Other: No significant volume of ascites.  No pneumoperitoneum. Musculoskeletal: Status post PLIF from L3-L5. There are no aggressive appearing lytic or blastic lesions noted in the visualized portions of the skeleton. IMPRESSION: 1. Extensive colonic diverticulosis with inflammatory changes adjacent to the sigmoid colon, concerning for acute diverticulitis. No definite diverticular abscess or signs of frank perforation are noted at this time. 2. Moderate-sized hiatal hernia. 3. Aortic atherosclerosis. 4. Additional incidental findings, as above. Electronically Signed   By: Vinnie Langton M.D.   On: 06/24/2021 09:28    ? ?Procedures ?Procedures  ? ? ?Medications Ordered in ED ?Medications  ?acetaminophen (TYLENOL) tablet 650 mg (has no administration in time range)  ?iohexol (OMNIPAQUE) 300 MG/ML solution 100 mL (100 mLs Intravenou

## 2021-06-24 NOTE — ED Notes (Signed)
Pt reports she is not able to give a urine sample at this time ?

## 2021-06-24 NOTE — ED Notes (Signed)
Pt does not want pain medication at this time. ?

## 2021-06-24 NOTE — ED Notes (Signed)
ED Provider at bedside. 

## 2021-06-24 NOTE — Discharge Instructions (Addendum)
Eat a very bland diet for the next few days until you start feeling better.  Stay hydrated and we would like for you to continue having bowel movements multiple times a day ?

## 2021-06-24 NOTE — ED Notes (Signed)
Pt call out ro inquire about how much longer she will need to be in ED. Pt updated on care plan regarding lab and imaging results. Primary RN Apolonio Schneiders updated ?

## 2021-06-24 NOTE — ED Triage Notes (Addendum)
Pt reports generalized abd pain that moves around for the past 10 days.  ?EDP arrived and pt began talking about family difficulties and stress prior to pain starting.  ?

## 2021-12-21 ENCOUNTER — Telehealth: Payer: Self-pay | Admitting: *Deleted

## 2021-12-21 NOTE — Patient Outreach (Signed)
  Care Coordination   Initial Visit Note   12/21/2021 Name: Holly Briggs MRN: [id number] DOB: Jul 29, 1934  Holly Briggs is a 87 y.o. year old female who sees Clarisse Gouge, [place] for primary care. I spoke with  Holly Briggs by phone today.  What matters to the patients health and wellness today?  No needs    Goals Addressed   None     SDOH assessments and interventions completed:  Yes  SDOH Interventions Today    Flowsheet Row Most Recent Value  SDOH Interventions   Food Insecurity Interventions Intervention Not Indicated  Transportation Interventions Intervention Not Indicated        Care Coordination Interventions Activated:  No  Care Coordination Interventions:  No, not indicated   Follow up plan: No further intervention required.   Encounter Outcome:  Pt. Refused   Brookelynn Hamor C. Myrtie Neither, MSN, [facility] Gerontological Nurse Practitioner [facility] Care Management [phone]

## 2022-01-15 IMAGING — CR DG RIBS W/ CHEST 3+V*L*
3 series · 3 of 3 positions shown · non-contrast
Comparison: None.

CLINICAL DATA: Left lower rib pain for 1 week. No known injury.

EXAM:
LEFT RIBS AND CHEST - 3+ VIEW

[w chest pa]
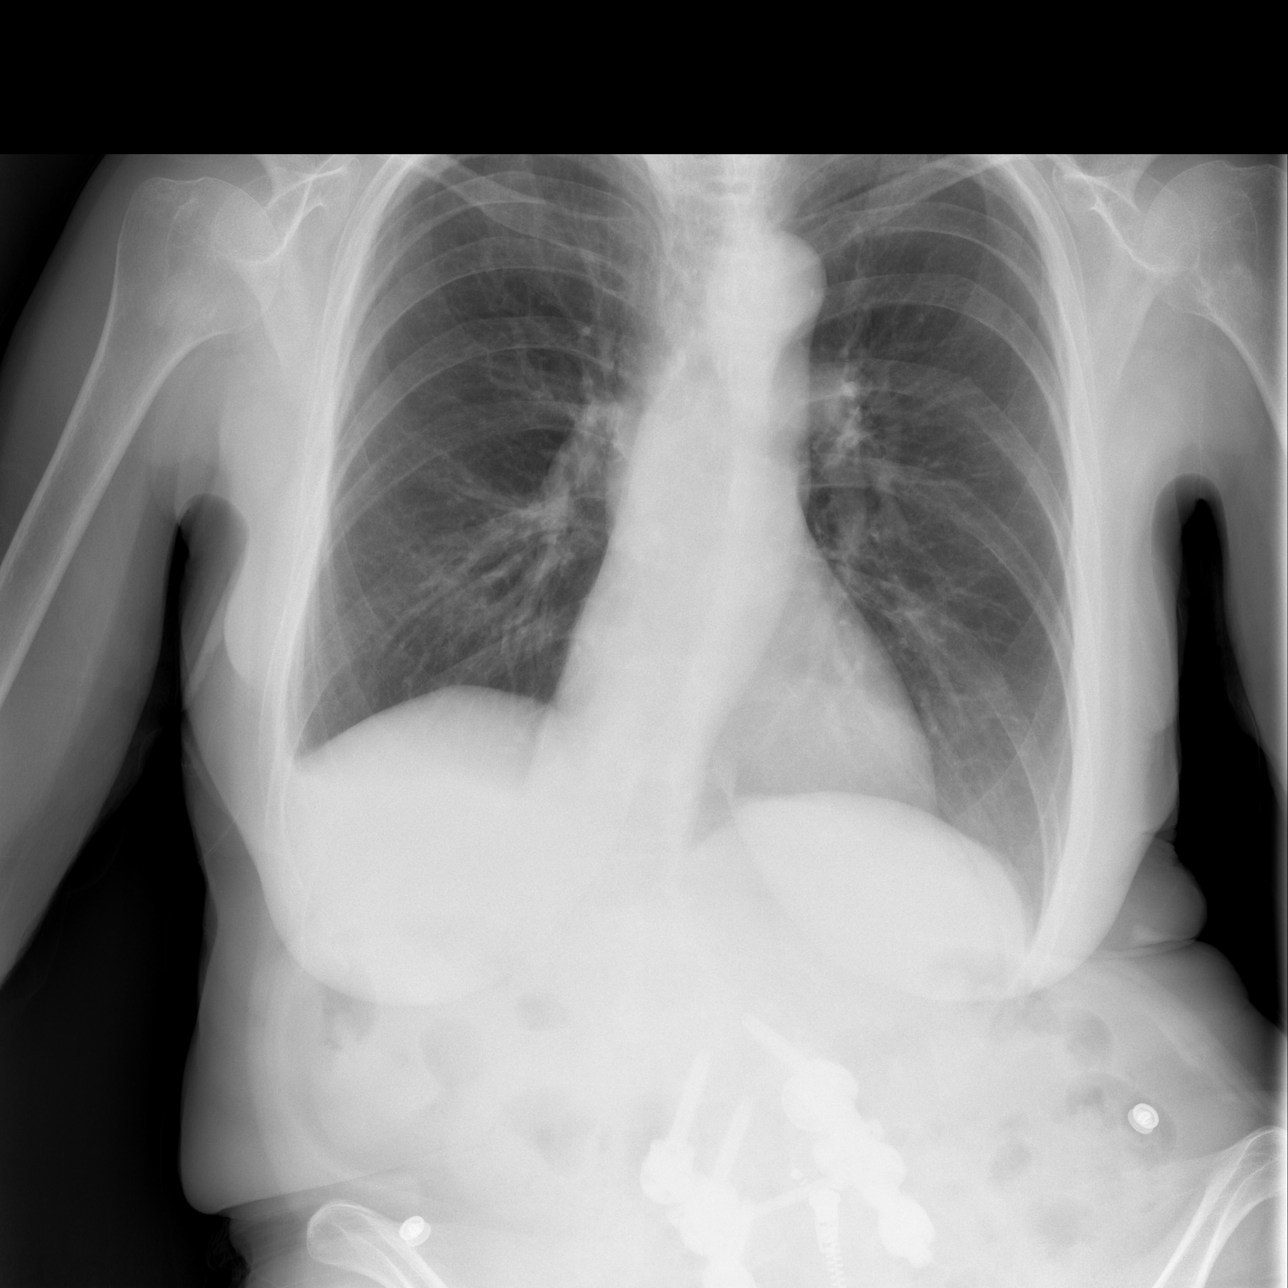

[w ribs ap/pa upper left]
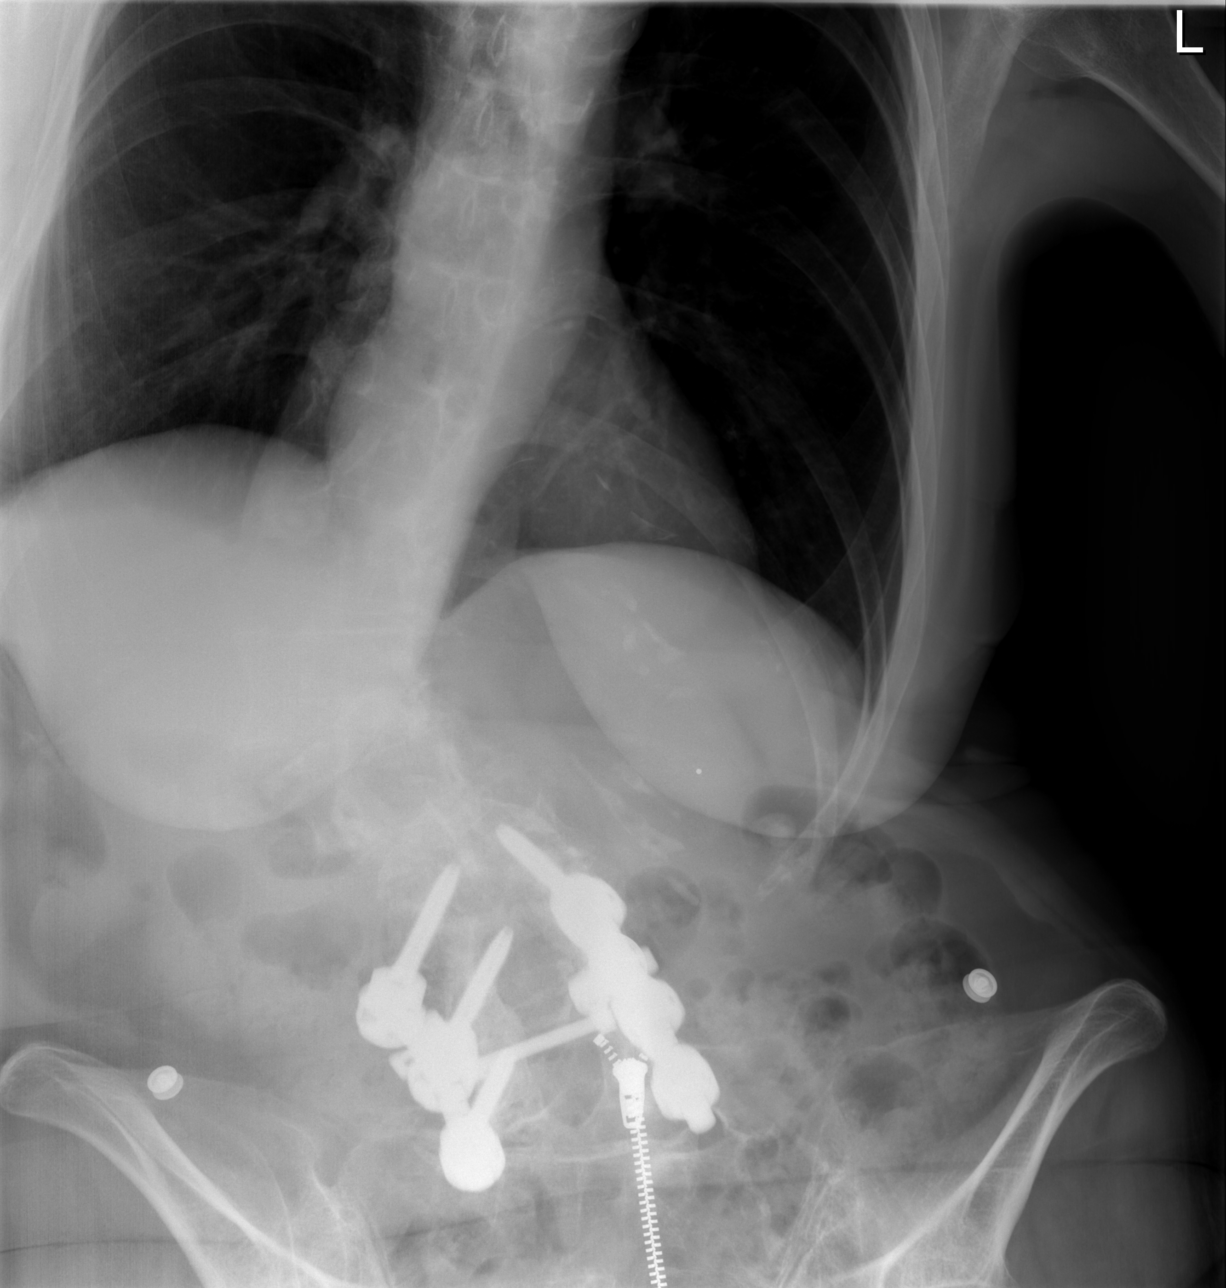

[w ribs oblique left]
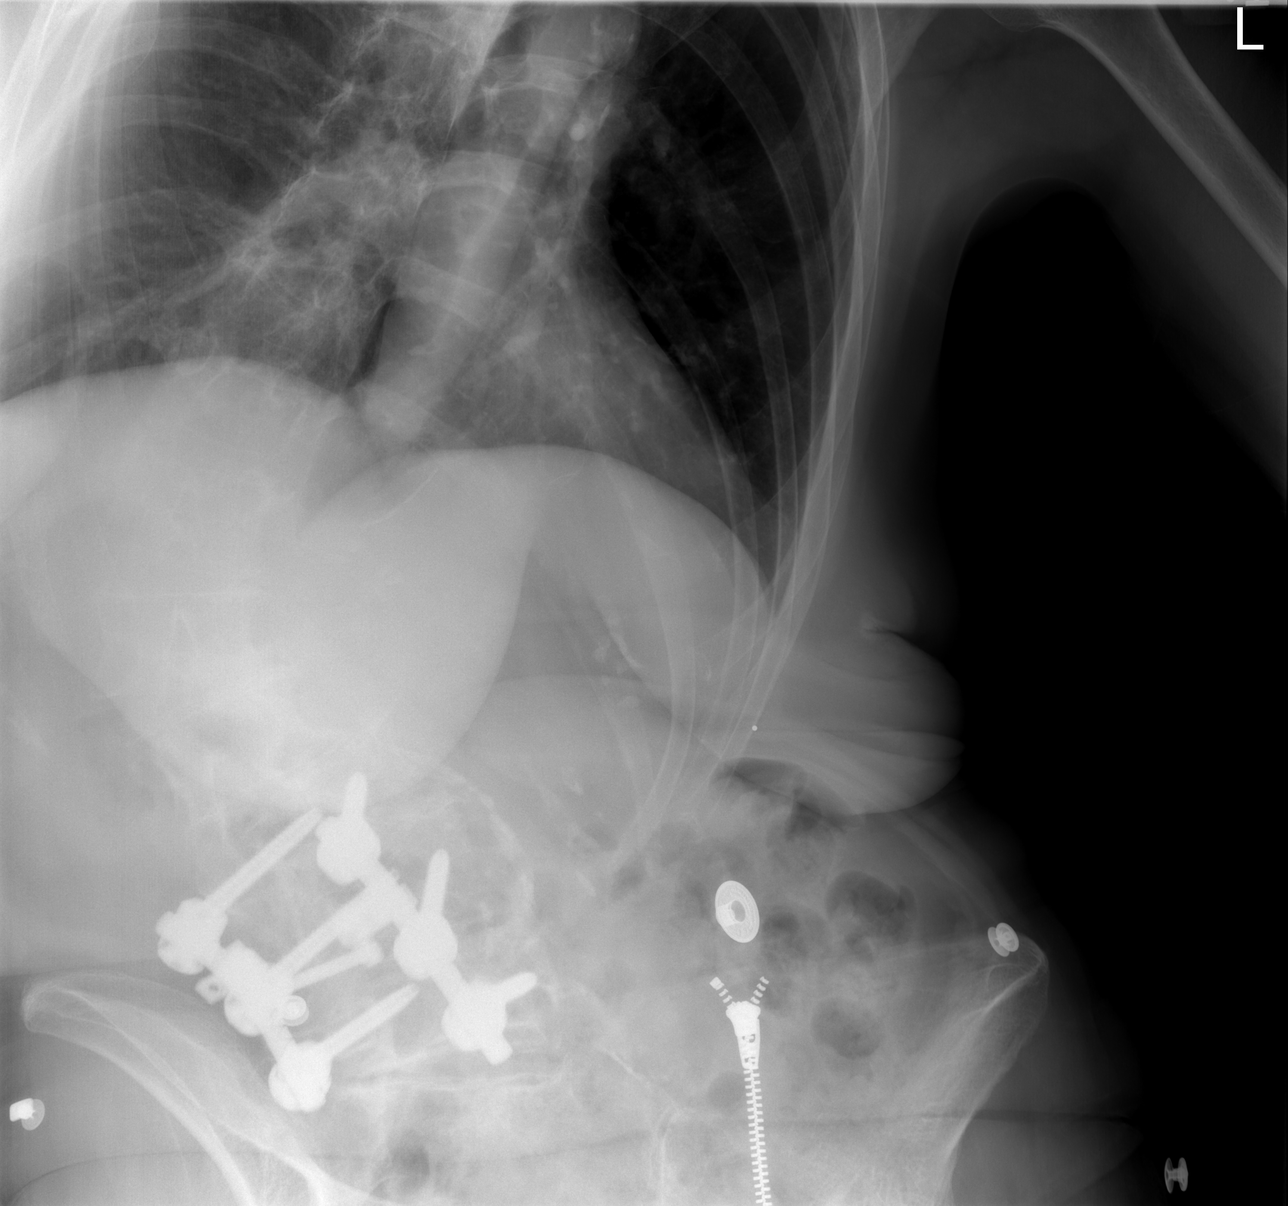

[3 of 3 positions shown; findings below may reference images not displayed]

FINDINGS: The cardiac silhouette, mediastinal and hilar contours are normal.
The lungs are clear. No pleural lesions. No pleural effusion or
pneumothorax.

Dedicated views of the left lower ribs do not demonstrate any
definite acute bony findings or destructive bony changes.
IMPRESSION: No acute cardiopulmonary findings and no definite left-sided rib
fractures.

## 2022-07-02 ENCOUNTER — Encounter: Payer: Self-pay | Admitting: *Deleted

## 2022-07-11 ENCOUNTER — Other Ambulatory Visit (HOSPITAL_COMMUNITY): Payer: Medicare Other

## 2022-07-11 ENCOUNTER — Ambulatory Visit: Payer: Medicare Other

## 2022-07-11 ENCOUNTER — Encounter (HOSPITAL_COMMUNITY): Payer: Medicare Other

## 2024-03-11 IMAGING — CT CT ABD-PELV W/ CM
2 of 5 series · 15 of 46 positions shown, 17 images · IV contrast (Omnipaque)
Comparison: CT the abdomen and pelvis 07/26/2020.

CLINICAL DATA: 86-year-old female with history of generalized
abdominal pain for the past 10 days.

EXAM:
CT ABDOMEN AND PELVIS WITH CONTRAST
TECHNIQUE: Multidetector CT imaging of the abdomen and pelvis was performed
using the standard protocol following bolus administration of
intravenous contrast.

[Series 2: axial st · axial · 0.87mm/px · z∈[-372,-32]mm · 12 of 78 slices shown, 14 images]
[im 5/78  soft-tissue]
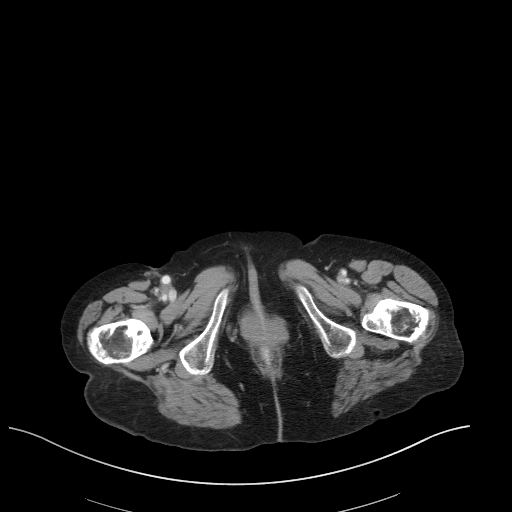
[im 5/78  bone]
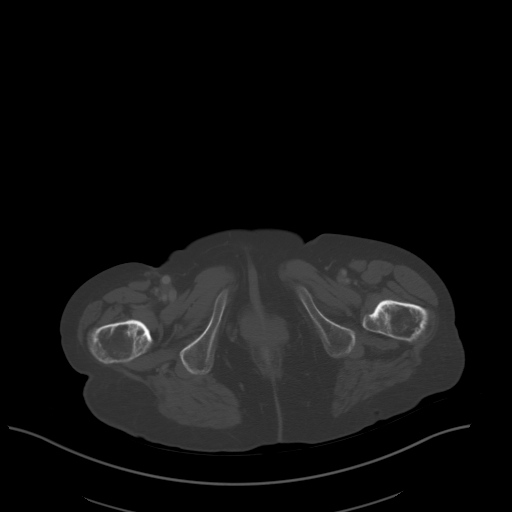
[im 13/78  soft-tissue]
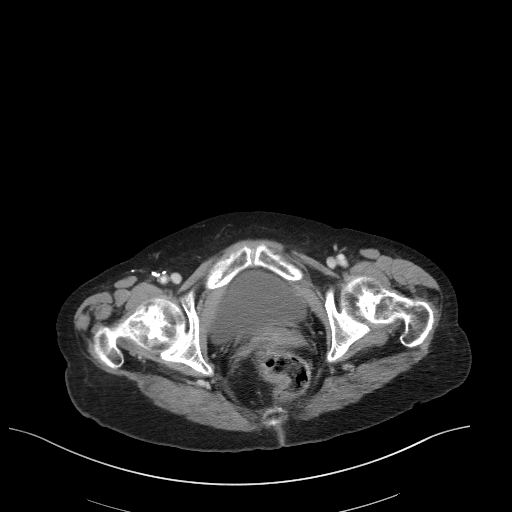
[im 17/78  soft-tissue]
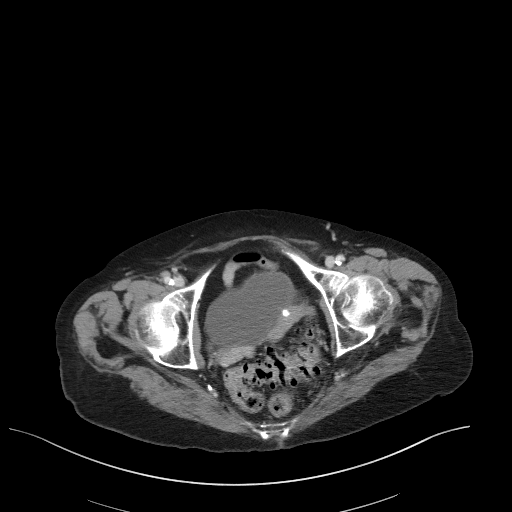
[im 25/78  soft-tissue]
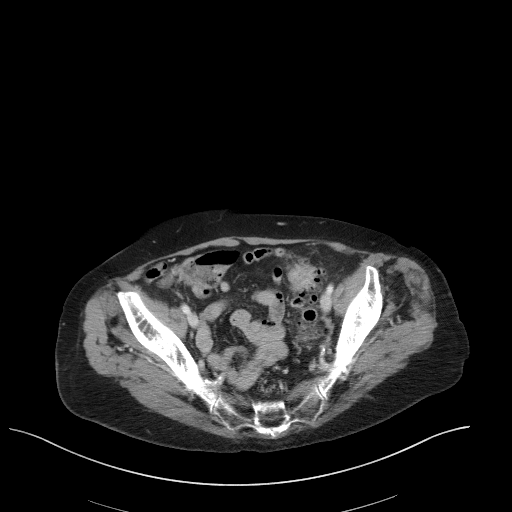
[im 29/78  soft-tissue]
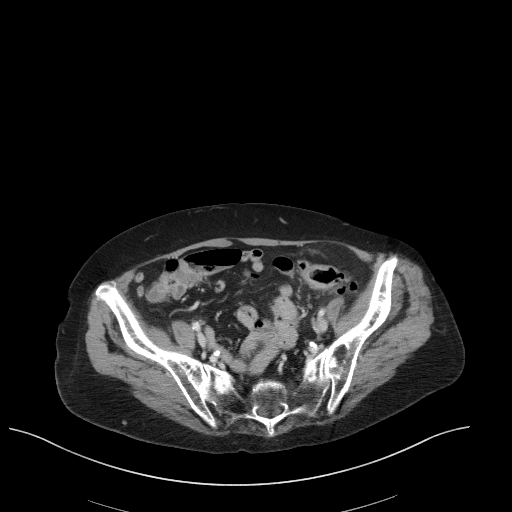
[im 37/78  soft-tissue]
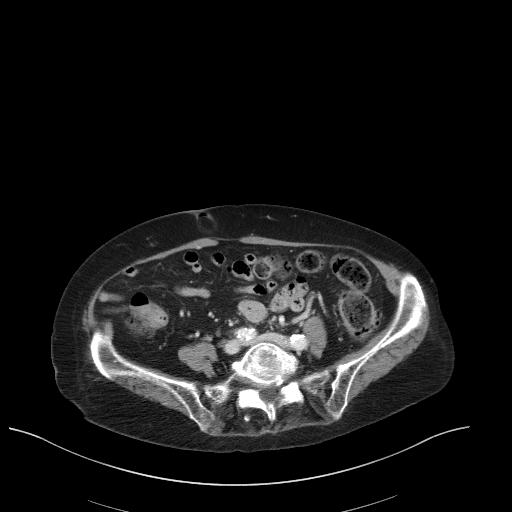
[im 41/78  soft-tissue]
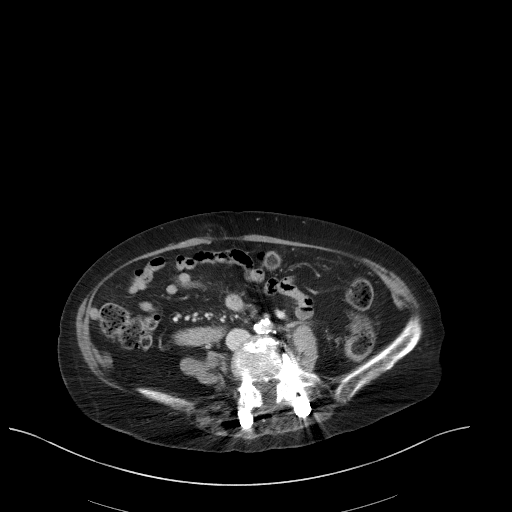
[im 49/78  soft-tissue]
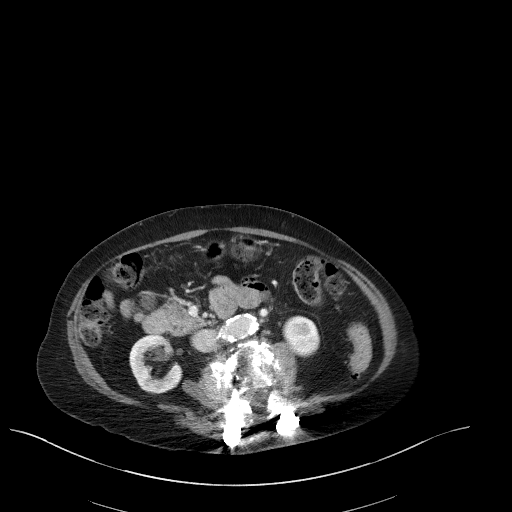
[im 53/78  soft-tissue]
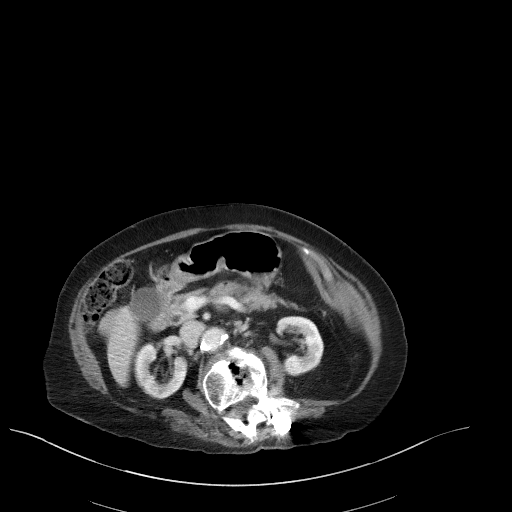
[im 53/78  bone]
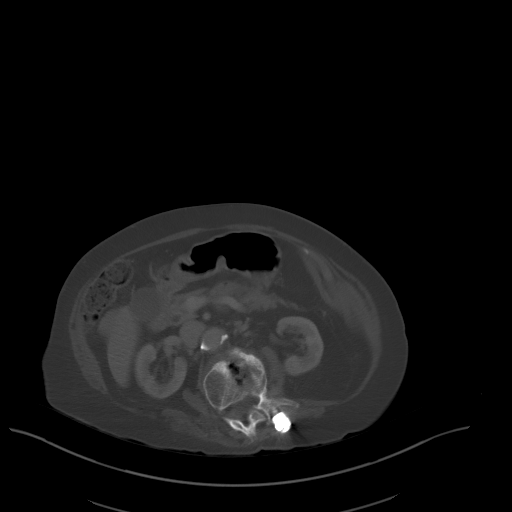
[im 61/78  soft-tissue]
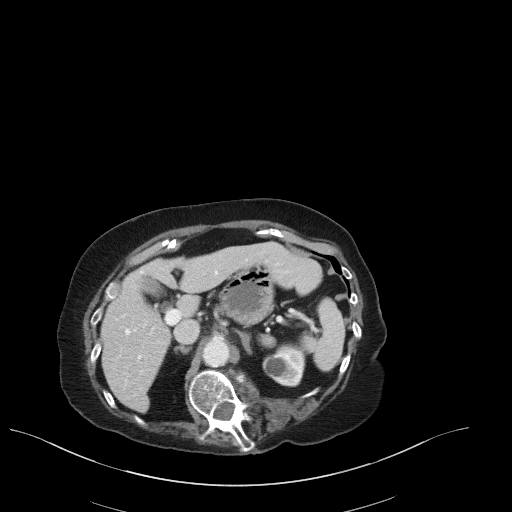
[im 65/78  soft-tissue]
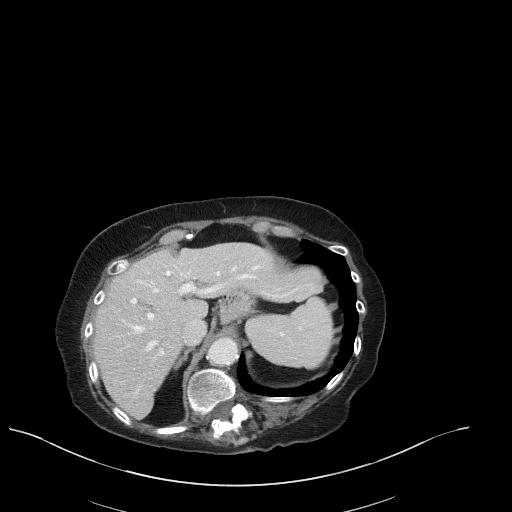
[im 73/78  soft-tissue]
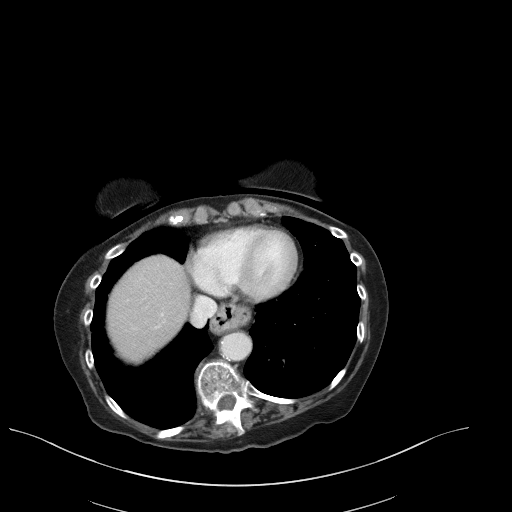

[Series 5: coronal st · coronal · 0.68mm/px · 3 of 101 slices shown]
[im 34/101  soft-tissue]
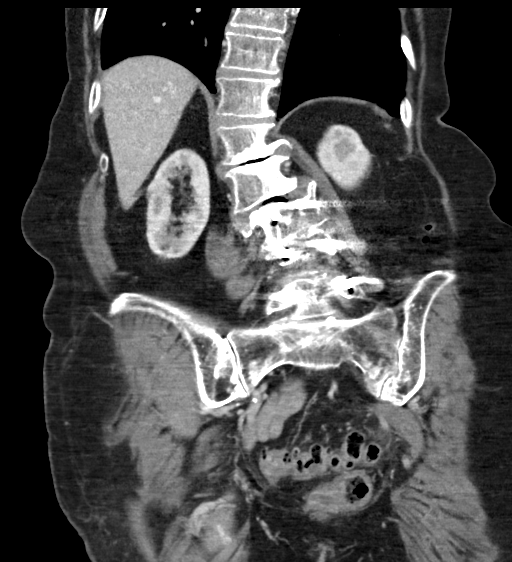
[im 45/101  soft-tissue]
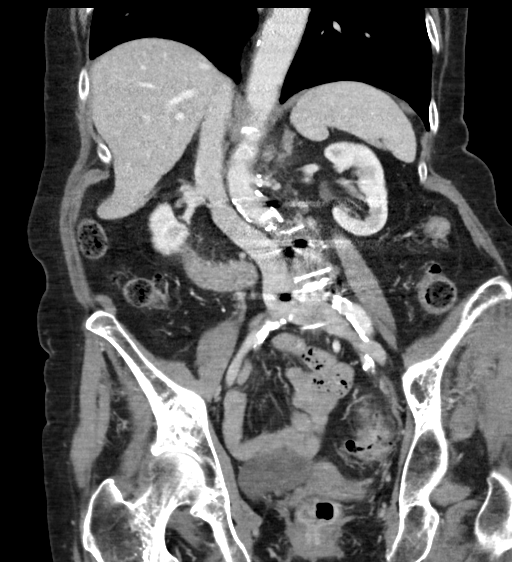
[im 56/101  soft-tissue]
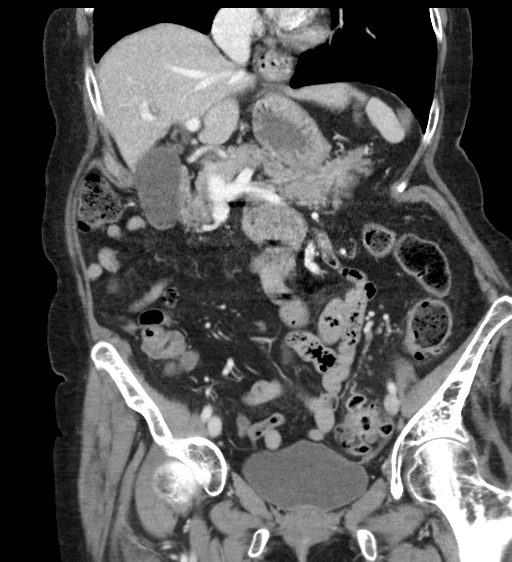

[15 of 46 positions shown; findings below may reference images not displayed]

RADIATION DOSE REDUCTION: This exam was performed according to the
departmental dose-optimization program which includes automated
exposure control, adjustment of the mA and/or kV according to
patient size and/or use of iterative reconstruction technique.

CONTRAST:  100mL OMNIPAQUE IOHEXOL 300 MG/ML  SOLN
FINDINGS: Lower chest: Atherosclerotic calcifications in the descending
thoracic aorta. Severe calcifications of the mitral annulus.
Moderate hiatal hernia.

Hepatobiliary: No suspicious cystic or solid hepatic lesions. No
intra or extrahepatic biliary ductal dilatation. Gallbladder is
normal in appearance.

Pancreas: No pancreatic mass. No pancreatic ductal dilatation. No
pancreatic or peripancreatic fluid collections or inflammatory
changes.

Spleen: Unremarkable.

Adrenals/Urinary Tract: Subcentimeter low-attenuation lesion in the
lower pole of the right kidney, too small to characterize, but
statistically likely to represent a cyst. 1.6 cm low-attenuation
lesion in the upper pole of the left kidney is compatible with a
simple cyst. Bilateral adrenal glands are normal in appearance. No
hydroureteronephrosis. Urinary bladder is normal in appearance.

Stomach/Bowel: Intra-abdominal portion of the stomach is normal. No
pathologic dilatation of small bowel or colon. Numerous colonic
diverticulae are noted, particularly in the sigmoid colon where
there is extensive mural thickening and there are some subtle
surrounding inflammatory changes suggesting mild acute
diverticulitis. No discrete diverticular abscess or signs of frank
perforation or confidently identified at this time. Normal appendix.

Vascular/Lymphatic: Aortic atherosclerosis, without evidence of
aneurysm or dissection in the abdominal or pelvic vasculature. No
lymphadenopathy noted in the abdomen or pelvis.

Reproductive: Uterus and ovaries are atrophic.

Other: No significant volume of ascites.  No pneumoperitoneum.

Musculoskeletal: Status post PLIF from L3-L5. There are no
aggressive appearing lytic or blastic lesions noted in the
visualized portions of the skeleton.
IMPRESSION: 1. Extensive colonic diverticulosis with inflammatory changes
adjacent to the sigmoid colon, concerning for acute diverticulitis.
No definite diverticular abscess or signs of frank perforation are
noted at this time.
2. Moderate-sized hiatal hernia.
3. Aortic atherosclerosis.
4. Additional incidental findings, as above.
